# Patient Record
Sex: Male | Born: 1995 | Hispanic: Yes | Marital: Single | Smoking: Never smoker
Health system: Southern US, Community
[De-identification: ages and names within clinical notes are randomized; demographics above are authoritative.]

## PROBLEM LIST (undated history)

## (undated) DIAGNOSIS — J45909 Unspecified asthma, uncomplicated: Secondary | ICD-10-CM

## (undated) DIAGNOSIS — T7840XA Allergy, unspecified, initial encounter: Secondary | ICD-10-CM

## (undated) HISTORY — DX: Allergy, unspecified, initial encounter: T78.40XA

## (undated) HISTORY — PX: TONSILLECTOMY: SUR1361

## (undated) HISTORY — DX: Unspecified asthma, uncomplicated: J45.909

---
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
## 2017-04-18 ENCOUNTER — Ambulatory Visit (INDEPENDENT_AMBULATORY_CARE_PROVIDER_SITE_OTHER): Payer: Managed Care, Other (non HMO) | Admitting: Podiatry

## 2017-04-18 ENCOUNTER — Encounter: Payer: Self-pay | Admitting: Podiatry

## 2017-04-18 ENCOUNTER — Ambulatory Visit (INDEPENDENT_AMBULATORY_CARE_PROVIDER_SITE_OTHER): Payer: Managed Care, Other (non HMO)

## 2017-04-18 VITALS — BP 99/59 | HR 65 | Resp 16

## 2017-04-18 DIAGNOSIS — M2041 Other hammer toe(s) (acquired), right foot: Secondary | ICD-10-CM | POA: Diagnosis not present

## 2017-04-18 DIAGNOSIS — M2040 Other hammer toe(s) (acquired), unspecified foot: Secondary | ICD-10-CM

## 2017-04-18 DIAGNOSIS — M2042 Other hammer toe(s) (acquired), left foot: Secondary | ICD-10-CM

## 2017-04-18 NOTE — Patient Instructions (Signed)
Pre-Operative Instructions  Congratulations, you have decided to take an important step towards improving your quality of life.  You can be assured that the doctors and staff at [facility] will be with you every step of the way.  Here are some important things you should know:  1. Plan to be at the surgery center/hospital at least 1 (one) hour prior to your scheduled time, unless otherwise directed by the surgical center/hospital staff.  You must have a responsible adult accompany you, remain during the surgery and drive you home.  Make sure you have directions to the surgical center/hospital to ensure you arrive on time. 2. If you are having surgery at [facility] or [facility] hospitals, you will need a copy of your medical history and physical form from your family physician within one month prior to the date of surgery. We will give you a form for your primary physician to complete.  3. We make every effort to accommodate the date you request for surgery.  However, there are times where surgery dates or times have to be moved.  We will contact you as soon as possible if a change in schedule is required.   4. No aspirin/ibuprofen for one week before surgery.  If you are on aspirin, any non-steroidal anti-inflammatory medications (Mobic, Aleve, Ibuprofen) should not be taken seven (7) days prior to your surgery.  You make take Tylenol for pain prior to surgery.  5. Medications - If you are taking daily heart and blood pressure medications, seizure, reflux, allergy, asthma, anxiety, pain or diabetes medications, make sure you notify the surgery center/hospital before the day of surgery so they can tell you which medications you should take or avoid the day of surgery. 6. No food or drink after midnight the night before surgery unless directed otherwise by surgical center/hospital staff. 7. No alcoholic beverages 24-hours prior to surgery.  No smoking 24-hours prior or 24-hours after  surgery. 8. Wear loose pants or shorts. They should be loose enough to fit over bandages, boots, and casts. 9. Don't wear slip-on shoes. Sneakers are preferred. 10. Bring your boot with you to the surgery center/hospital.  Also bring crutches or a walker if your physician has prescribed it for you.  If you do not have this equipment, it will be provided for you after surgery. 11. If you have not been contacted by the surgery center/hospital by the day before your surgery, call to confirm the date and time of your surgery. 12. Leave-time from work may vary depending on the type of surgery you have.  Appropriate arrangements should be made prior to surgery with your employer. 13. Prescriptions will be provided immediately following surgery by your doctor.  Fill these as soon as possible after surgery and take the medication as directed. Pain medications will not be refilled on weekends and must be approved by the doctor. 14. Remove nail polish on the operative foot and avoid getting pedicures prior to surgery. 15. Wash the night before surgery.  The night before surgery wash the foot and leg well with water and the antibacterial soap provided. Be sure to pay special attention to beneath the toenails and in between the toes.  Wash for at least three (3) minutes. Rinse thoroughly with water and dry well with a towel.  Perform this wash unless told not to do so by your physician.  Enclosed: 1 Ice pack (please put in freezer the night before surgery)   1 Hibiclens skin cleaner     Pre-op instructions  If you have any questions regarding the instructions, please do not hesitate to call our office.  [place]: [address], [place], [place] [address] -- [phone]  [place]: [address] , [place] [address] -- [phone]  [place]: [address]  [place], [place] [address] -- [phone]  [place]: [address], [address], [place], [place] [address] -- [phone]  Website:  [email or web address] 

## 2017-04-18 NOTE — Progress Notes (Signed)
   Subjective:    Patient ID: Johnny Pierce, male    DOB: 01-Apr-1996, 21 y.o.   MRN: [id number]  HPI: He presents today with his mother with a chief complaint of painful fifth toes bilaterally. He states that his toes are starting to roll beneath his fourth toe and this is causing pain for him when he walks. He states that he walks a lot at work and it causes outside of his leg and foot to hurt.  Review of Systems  All other systems reviewed and are negative.      Objective:   Physical Exam: Vital signs are stable alert and oriented 3. Pulses are palpable. Neurologic sensorium is intact. Deep tendon reflexes are intact. Muscle strength is normal and symmetrical bilateral. Orthopedic evaluation strength all joints distal to the ankle for range of motion with crepitation. Cutaneous evaluation stress of well-hydrated cutis. No open lesions or wounds. Adductor varus rotated hammertoe deformities fifth bilateral. They're minimally reducible.. Radiographs taken today demonstrate osseous immature individual with moderate severe adductovarus rotated hammertoe deformity particularly at the level of the DIPJ.        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  Plan: After thorough discussion we consented him today for surgical evaluation and correction. We consented him for derotational arthroplasties DIPJ fifth digit bilateral. I answered all questions regarding these procedures to the best mobility limbs terms he would likely be out of work for least 4 weeks. I will follow-up with him in the near future for surgical intervention.

## 2017-04-20 ENCOUNTER — Ambulatory Visit (INDEPENDENT_AMBULATORY_CARE_PROVIDER_SITE_OTHER): Payer: Managed Care, Other (non HMO) | Admitting: Physician Assistant

## 2017-04-20 ENCOUNTER — Encounter: Payer: Self-pay | Admitting: Physician Assistant

## 2017-04-20 VITALS — BP 118/72 | HR 68 | Temp 97.7°F | Ht 64.0 in | Wt 178.6 lb

## 2017-04-20 DIAGNOSIS — Z23 Encounter for immunization: Secondary | ICD-10-CM

## 2017-04-20 DIAGNOSIS — Z131 Encounter for screening for diabetes mellitus: Secondary | ICD-10-CM | POA: Diagnosis not present

## 2017-04-20 DIAGNOSIS — M2040 Other hammer toe(s) (acquired), unspecified foot: Secondary | ICD-10-CM

## 2017-04-20 DIAGNOSIS — Z1329 Encounter for screening for other suspected endocrine disorder: Secondary | ICD-10-CM | POA: Diagnosis not present

## 2017-04-20 DIAGNOSIS — Z13220 Encounter for screening for lipoid disorders: Secondary | ICD-10-CM | POA: Diagnosis not present

## 2017-04-20 DIAGNOSIS — J45909 Unspecified asthma, uncomplicated: Secondary | ICD-10-CM

## 2017-04-20 DIAGNOSIS — Z833 Family history of diabetes mellitus: Secondary | ICD-10-CM

## 2017-04-20 DIAGNOSIS — Z0000 Encounter for general adult medical examination without abnormal findings: Secondary | ICD-10-CM

## 2017-04-20 DIAGNOSIS — Z114 Encounter for screening for human immunodeficiency virus [HIV]: Secondary | ICD-10-CM

## 2017-04-20 DIAGNOSIS — Z01818 Encounter for other preprocedural examination: Secondary | ICD-10-CM

## 2017-04-20 DIAGNOSIS — Z113 Encounter for screening for infections with a predominantly sexual mode of transmission: Secondary | ICD-10-CM

## 2017-04-20 NOTE — Progress Notes (Signed)
Patient: Johnny PartWilliam A Pierce Male    DOB: 03/12/1996   21 y.o.   MRN: [id number] Visit Date: 04/20/2017  Today's Provider: Trey SailorsAdriana M Taija Mathias, PA-C   Chief Complaint  Patient presents with  . Establish Care  . Annual Exam   Subjective:    HPI Johnny Pierce is a 21 year old male who presents today to Establish Care as a new patient. Patient [facility] a [facility]. He is feeling well. He reports exercising occasionally, running or soccer twice weekly. He is sleeping well. 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 He lives in [place] with his parents.  He is sexually active with a single male partner, uses condoms at every sexual encounter. Would like to be screened for STIs today. He smokes marijuana but does not use other drugs. No cigarettes. Drinks once per month.  He has a history of asthma well controlled with PRN albuterol inhaler. He has seasonal allergies controlled with Zyrtec.  He will be having surgery for hammer toes of both feet soon. He is unsure of surgery date at this time. Dr. Al CorpusHyatt will be doing the surgery, and he reports he needs an annual physical/surgery clearance for this. Does not have form to fill out.  He has not heart problems or sleep apnea. He has had a tonsillectomy in the past and tolerated anesthesia well. No history of sleep apnea.   Due for 2nd series of Hep A and Menveo, and also first series of HPV.     Previous Medications   ALBUTEROL (PROVENTIL HFA;VENTOLIN HFA) 108 (90 BASE) MCG/ACT INHALER    Inhale into the lungs every 6 (six) hours as needed for wheezing or shortness of breath.   CETIRIZINE (ZYRTEC) 10 MG TABLET    Take 1 tablet (10 mg total) by mouth daily.   MOMETASONE (NASONEX) 50 MCG/ACT NASAL SPRAY    Place 2 sprays into the nose daily.    Review of Systems  Constitutional: Negative.   HENT: Negative.   Eyes: Negative.   Respiratory: Negative.   Cardiovascular: Negative.     Gastrointestinal: Negative.   Endocrine: Negative.   Genitourinary: Negative.   Musculoskeletal: Negative.   Skin: Negative.   Allergic/Immunologic: Negative.   Neurological: Negative.   Hematological: Negative.   Psychiatric/Behavioral: Negative.     Social History  Substance Use Topics  . Smoking status: Never Smoker  . Smokeless tobacco: Never Used  . Alcohol use Yes     Comment: occasionally   Objective:   BP 118/72 (BP Location: Left Arm, Patient Position: Sitting, Cuff Size: Normal)   Pulse 68   Temp 97.7 F (36.5 C) (Oral)   Ht 5\' 4"  (1.626 m)   Wt 178 lb 9.6 oz (81 kg)   SpO2 98%   BMI 30.66 kg/m   Physical Exam  Constitutional: He is oriented to person, place, and time. He appears well-developed and well-nourished.  HENT:  Right Ear: External ear normal.  Left Ear: External ear normal.  Mouth/Throat: Oropharynx is clear and moist. No oropharyngeal exudate.  Eyes: Conjunctivae are normal.  Neck: Normal range of motion. Neck supple.  Cardiovascular: Normal rate and regular rhythm.   Pulmonary/Chest: Effort normal and breath sounds normal.  Abdominal: Soft. Bowel sounds are normal.  Lymphadenopathy:    He has no cervical adenopathy.  Neurological: He is alert and oriented to person, place, and time.  Skin: Skin is warm and dry.  Psychiatric: He  has a normal mood and affect. His behavior is normal.        Assessment & Plan:     1. Annual physical exam  - CBC With Differential  2. Pre-op evaluation  Normal EKG.  - EKG 12-Lead  3. Family history of diabetes mellitus   4. Asthma, unspecified asthma severity, unspecified whether complicated, unspecified whether persistent  Controlled on albuterol.  5. Hammer toe, unspecified laterality  Soon to be scheduled for surgery with Dr. Al Corpus.  6. Routine screening for STI (sexually transmitted infection)  - Chlamydia/Gonococcus/Trichomonas, NAA  7. Lipid screening  - Lipid Profile  8. Diabetes  mellitus screening  - Comprehensive metabolic panel  9. Thyroid disorder screening  - TSH  10. Encounter for screening for HIV  - HIV antibody  11. Need for HPV vaccination  Updated today. Should come back in 2 mo for second dose.  - HPV 9-valent vaccine,Recombinat  12. Need for meningitis vaccination  Updated today.  - Meningococcal MCV4O(Menveo)  13. Need for hepatitis A immunization  Updated today.    Follow up: Return in about 1 year (around 04/20/2018) for CPE.

## 2017-04-20 NOTE — Patient Instructions (Signed)

## 2017-04-21 ENCOUNTER — Telehealth: Payer: Self-pay | Admitting: Podiatry

## 2017-04-21 NOTE — Telephone Encounter (Signed)
Pt called with a question. Requested a call back at [phone].

## 2017-04-26 ENCOUNTER — Telehealth: Payer: Self-pay | Admitting: *Deleted

## 2017-04-26 NOTE — Telephone Encounter (Signed)
"  I'd like to schedule my surgery with Dr. Al CorpusHyatt."  Do you have a date in mind?  "I'd like to do it as soon as possible."  He can do it on May 27, 2017.  "That date will be fine.  What time?"  It will be sometime that morning.  Someone from the surgical center will call you with the arrival time a day or two prior to the surgery date.

## 2017-05-03 ENCOUNTER — Telehealth: Payer: Self-pay

## 2017-05-03 DIAGNOSIS — Z0000 Encounter for general adult medical examination without abnormal findings: Secondary | ICD-10-CM

## 2017-05-03 LAB — CBC WITH DIFFERENTIAL/PLATELET
Basophils Absolute: 93 cells/uL (ref 0–200)
Basophils Relative: 1 %
Eosinophils Absolute: 149 cells/uL (ref 15–500)
Eosinophils Relative: 1.6 %
HCT: 40.3 % (ref 38.5–50.0)
Hemoglobin: 12.2 g/dL — ABNORMAL LOW (ref 13.2–17.1)
Lymphs Abs: 3701 cells/uL (ref 850–3900)
MCH: 19 pg — ABNORMAL LOW (ref 27.0–33.0)
MCHC: 30.3 g/dL — ABNORMAL LOW (ref 32.0–36.0)
MCV: 62.7 fL — ABNORMAL LOW (ref 80.0–100.0)
MPV: 9.3 fL (ref 7.5–12.5)
Monocytes Relative: 13.8 %
Neutro Abs: 4073 cells/uL (ref 1500–7800)
Neutrophils Relative %: 43.8 %
Platelets: 318 10*3/uL (ref 140–400)
RBC: 6.43 10*6/uL — ABNORMAL HIGH (ref 4.20–5.80)
RDW: 17.5 % — ABNORMAL HIGH (ref 11.0–15.0)
Total Lymphocyte: 39.8 %
WBC mixed population: 1283 cells/uL — ABNORMAL HIGH (ref 200–950)
WBC: 9.3 10*3/uL (ref 3.8–10.8)

## 2017-05-03 LAB — COMPLETE METABOLIC PANEL WITH GFR
AG Ratio: 1.9 (calc) (ref 1.0–2.5)
ALT: 15 U/L (ref 8–46)
AST: 11 U/L — ABNORMAL LOW (ref 12–32)
Albumin: 4.5 g/dL (ref 3.6–5.1)
Alkaline phosphatase (APISO): 88 U/L (ref 48–230)
BUN/Creatinine Ratio: 29 (calc) — ABNORMAL HIGH (ref 6–22)
BUN: 22 mg/dL — ABNORMAL HIGH (ref 7–20)
CO2: 25 mmol/L (ref 20–32)
Calcium: 9.4 mg/dL (ref 8.9–10.4)
Chloride: 105 mmol/L (ref 98–110)
Creat: 0.75 mg/dL (ref 0.60–1.26)
GFR, Est African American: 154 mL/min/{1.73_m2} (ref >=60)
GFR, Est Non African American: 133 mL/min/{1.73_m2} (ref >=60)
Globulin: 2.4 g/dL (calc) (ref 2.1–3.5)
Glucose, Bld: 95 mg/dL (ref 65–99)
Potassium: 4.1 mmol/L (ref 3.8–5.1)
Sodium: 138 mmol/L (ref 135–146)
Total Bilirubin: 0.4 mg/dL (ref 0.2–1.1)
Total Protein: 6.9 g/dL (ref 6.3–8.2)

## 2017-05-03 LAB — HIV ANTIBODY (ROUTINE TESTING W REFLEX): HIV 1&2 Ab, 4th Generation: NONREACTIVE

## 2017-05-03 LAB — LIPID PANEL
Cholesterol: 153 mg/dL (ref <170)
HDL: 30 mg/dL — ABNORMAL LOW (ref >45)
LDL Cholesterol (Calc): 102 mg/dL (calc) (ref <110)
Non-HDL Cholesterol (Calc): 123 mg/dL (calc) — ABNORMAL HIGH (ref <120)
Total CHOL/HDL Ratio: 5.1 (calc) — ABNORMAL HIGH (ref <5.0)
Triglycerides: 109 mg/dL — ABNORMAL HIGH (ref <90)

## 2017-05-03 LAB — TSH: TSH: 2.12 mIU/L (ref 0.50–4.30)

## 2017-05-03 NOTE — Telephone Encounter (Signed)
[facility].   Thanks,   -Vernona RiegerLaura

## 2017-05-04 ENCOUNTER — Ambulatory Visit: Payer: Managed Care, Other (non HMO) | Admitting: Physician Assistant

## 2017-05-04 ENCOUNTER — Other Ambulatory Visit: Payer: Self-pay | Admitting: Physician Assistant

## 2017-05-04 ENCOUNTER — Telehealth: Payer: Self-pay

## 2017-05-04 DIAGNOSIS — Z113 Encounter for screening for infections with a predominantly sexual mode of transmission: Secondary | ICD-10-CM

## 2017-05-04 NOTE — Telephone Encounter (Signed)
-----   Message from Trey SailorsAdriana M Pollak, [place]-C sent at 05/04/2017  9:52 AM EDT ----- Labs normal. Come in to give urine sample for screening when possible, thanks.

## 2017-05-04 NOTE — Telephone Encounter (Signed)
[email or web address] 

## 2017-05-04 NOTE — Progress Notes (Signed)
Reordered STI screening for [facility], please print out that lab slip when sending off.

## 2017-05-10 NOTE — Telephone Encounter (Signed)
[name], RMA  

## 2017-05-25 ENCOUNTER — Other Ambulatory Visit: Payer: Self-pay | Admitting: Podiatry

## 2017-05-25 MED ORDER — PROMETHAZINE HCL 25 MG PO TABS: 25.0000 mg | ORAL_TABLET | Freq: Three times a day (TID) | ORAL | 0 refills | Status: DC | PRN

## 2017-05-25 MED ORDER — CEPHALEXIN 500 MG PO CAPS: 500.0000 mg | ORAL_CAPSULE | Freq: Three times a day (TID) | ORAL | 0 refills | Status: DC

## 2017-05-25 MED ORDER — OXYCODONE-ACETAMINOPHEN 10-325 MG PO TABS: 1.0000 | ORAL_TABLET | ORAL | 0 refills | Status: DC | PRN

## 2017-05-27 ENCOUNTER — Encounter: Payer: Self-pay | Admitting: Podiatry

## 2017-05-27 DIAGNOSIS — M2041 Other hammer toe(s) (acquired), right foot: Secondary | ICD-10-CM | POA: Diagnosis not present

## 2017-05-27 DIAGNOSIS — M2042 Other hammer toe(s) (acquired), left foot: Secondary | ICD-10-CM | POA: Diagnosis not present

## 2017-06-01 ENCOUNTER — Encounter: Payer: Managed Care, Other (non HMO) | Admitting: Podiatry

## 2017-06-01 ENCOUNTER — Ambulatory Visit (INDEPENDENT_AMBULATORY_CARE_PROVIDER_SITE_OTHER): Payer: Managed Care, Other (non HMO) | Admitting: Podiatry

## 2017-06-01 ENCOUNTER — Encounter: Payer: Self-pay | Admitting: Podiatry

## 2017-06-01 ENCOUNTER — Ambulatory Visit (INDEPENDENT_AMBULATORY_CARE_PROVIDER_SITE_OTHER): Payer: Managed Care, Other (non HMO)

## 2017-06-01 VITALS — BP 123/71 | HR 68 | Temp 98.2°F | Resp 16

## 2017-06-01 DIAGNOSIS — M2041 Other hammer toe(s) (acquired), right foot: Secondary | ICD-10-CM | POA: Diagnosis not present

## 2017-06-01 DIAGNOSIS — M2042 Other hammer toe(s) (acquired), left foot: Secondary | ICD-10-CM

## 2017-06-01 NOTE — Progress Notes (Signed)
He presents today for follow-up of hammertoe repair fifth bilateral. He states that he is doing pretty well. His mother states that he did leave the house for a period time yesterday. States that his toes are somewhat tender today.  Objective: Vital signs are stable alert and oriented 3 pulses are palpable. No erythema edema cellulitis drainage or odor. Sutures are intact or coapted toes are rectus. Radiographs confirm very nice derotational arthroplasties.  Assessment: Well-healing surgical toes.  Plan: Redressed a dry sterile compressive dressing follow-up with him in 1 week for possible suture removal.

## 2017-06-03 ENCOUNTER — Telehealth: Payer: Self-pay | Admitting: Podiatry

## 2017-06-03 NOTE — Telephone Encounter (Signed)
Yes, I left paperwork from my job with the fax number so she can fax my job the note. This is for me to be out of work. Either she forgot to send it or never sent it at all. Please call me back at [phone].

## 2017-06-08 ENCOUNTER — Encounter: Payer: Self-pay | Admitting: Podiatry

## 2017-06-08 ENCOUNTER — Ambulatory Visit (INDEPENDENT_AMBULATORY_CARE_PROVIDER_SITE_OTHER): Payer: Managed Care, Other (non HMO) | Admitting: Podiatry

## 2017-06-08 ENCOUNTER — Ambulatory Visit: Payer: Managed Care, Other (non HMO) | Admitting: Podiatry

## 2017-06-08 DIAGNOSIS — M2042 Other hammer toe(s) (acquired), left foot: Secondary | ICD-10-CM

## 2017-06-08 DIAGNOSIS — M2041 Other hammer toe(s) (acquired), right foot: Secondary | ICD-10-CM

## 2017-06-08 NOTE — Progress Notes (Signed)
He presents today for his second postop visit status post hammertoe repair fifth digit bilaterally. He states it feels like her doing okay to me.  Objective: Vital signs are stable he is alert and oriented 3 presents in his [place] shoes today using a cane. Dry sterile dressing was removed demonstrates no erythema edema cellulitis drainage or odor. Sutures are in place margins well coapted sutures were removed today.  Assessment: Well coapted margins hammertoe repair fifth bilateral.  Plan: We will wrap the toes on a daily basis he understands this and is amenable to it. Follow up with him in 2 weeks he will remain out of work until that time.

## 2017-06-22 ENCOUNTER — Ambulatory Visit (INDEPENDENT_AMBULATORY_CARE_PROVIDER_SITE_OTHER): Payer: Managed Care, Other (non HMO) | Admitting: Podiatry

## 2017-06-22 ENCOUNTER — Ambulatory Visit (INDEPENDENT_AMBULATORY_CARE_PROVIDER_SITE_OTHER): Payer: Managed Care, Other (non HMO)

## 2017-06-22 ENCOUNTER — Encounter: Payer: Self-pay | Admitting: Podiatry

## 2017-06-22 DIAGNOSIS — M2041 Other hammer toe(s) (acquired), right foot: Secondary | ICD-10-CM | POA: Diagnosis not present

## 2017-06-22 DIAGNOSIS — M2042 Other hammer toe(s) (acquired), left foot: Secondary | ICD-10-CM

## 2017-06-22 NOTE — Progress Notes (Signed)
He presents today for follow-up of his hammertoe repair fifth digits bilateral. He is very happy with the outcome. He states is still tender with pressure on them.  Objective: Digital compression wrap was intact once removed demonstrates nearly rectus hammertoe repair fifth digits bilateral. Mild edema no erythema cellulitis drainage or odor no skin breakdown.  Second day demonstrated the rotated fifth toes bilateral.  Assessment: Well-healing surgical toes bilateral.  Plan: I would allow him to get back to work continuing to dress the toes daily. He'll follow-up with me in 1 month.

## 2017-06-23 NOTE — Progress Notes (Signed)
DOS 10.05.18 Derotational arthroplasty 5th toe bilateral

## 2017-07-20 ENCOUNTER — Ambulatory Visit: Payer: Managed Care, Other (non HMO)

## 2017-07-20 ENCOUNTER — Encounter: Payer: Managed Care, Other (non HMO) | Admitting: Podiatry

## 2017-07-20 DIAGNOSIS — M2042 Other hammer toe(s) (acquired), left foot: Principal | ICD-10-CM

## 2017-07-20 DIAGNOSIS — M2041 Other hammer toe(s) (acquired), right foot: Secondary | ICD-10-CM

## 2017-07-20 NOTE — Progress Notes (Signed)
This encounter was created in error - please disregard.

## 2017-08-03 ENCOUNTER — Ambulatory Visit (INDEPENDENT_AMBULATORY_CARE_PROVIDER_SITE_OTHER): Payer: Managed Care, Other (non HMO) | Admitting: Podiatry

## 2017-08-03 ENCOUNTER — Ambulatory Visit (INDEPENDENT_AMBULATORY_CARE_PROVIDER_SITE_OTHER): Payer: Managed Care, Other (non HMO)

## 2017-08-03 ENCOUNTER — Encounter: Payer: Self-pay | Admitting: Podiatry

## 2017-08-03 DIAGNOSIS — M2041 Other hammer toe(s) (acquired), right foot: Secondary | ICD-10-CM

## 2017-08-03 DIAGNOSIS — M2042 Other hammer toe(s) (acquired), left foot: Secondary | ICD-10-CM | POA: Diagnosis not present

## 2017-08-03 NOTE — Progress Notes (Signed)
He presents today for his follow-up of his fifth hammertoe repair bilaterally.  He states they feel great there is no problem other than the get a little tired by the end of the day.  Objective: Vital signs are stable alert and oriented x3 toes are in good rectus position.  Mild edema to the distal aspect of the toes.  Radiographs confirm nice arthroplasties no bony overgrowth.  Assessment: Well-healing surgical tenderness.  Plan wrap the toes on a daily basis and follow-up with me on an as-needed basis.

## 2017-08-25 IMAGING — CR DG WRIST COMPLETE 3+V*R*
4 series · 4 of 4 positions shown · non-contrast
Comparison: None.

CLINICAL DATA: Lump on the anterior side of the right wrist over
the last week.

EXAM:
RIGHT WRIST - COMPLETE 3+ VIEW

[wrist pa]
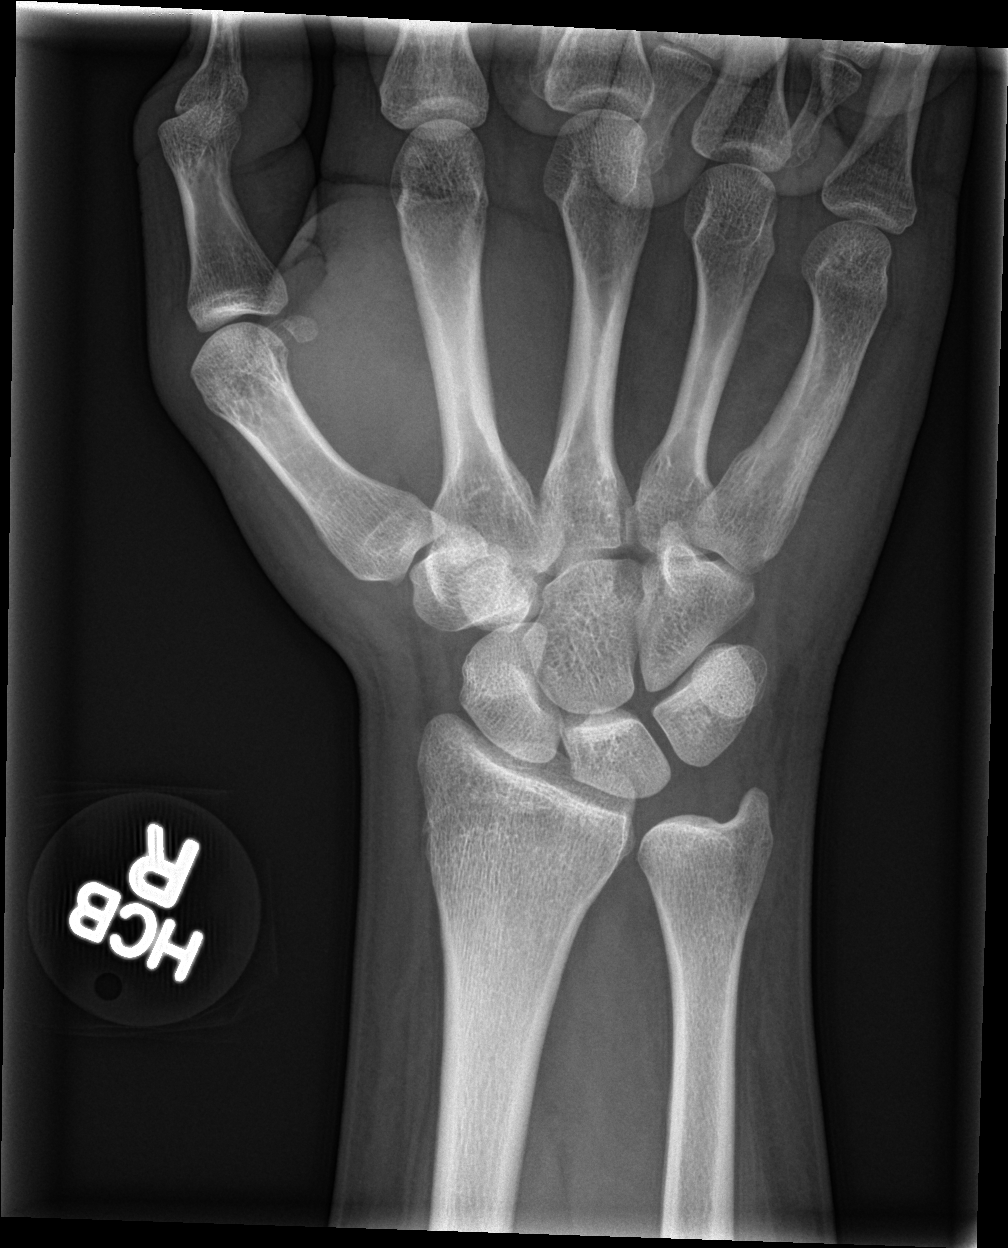

[wrist obl]
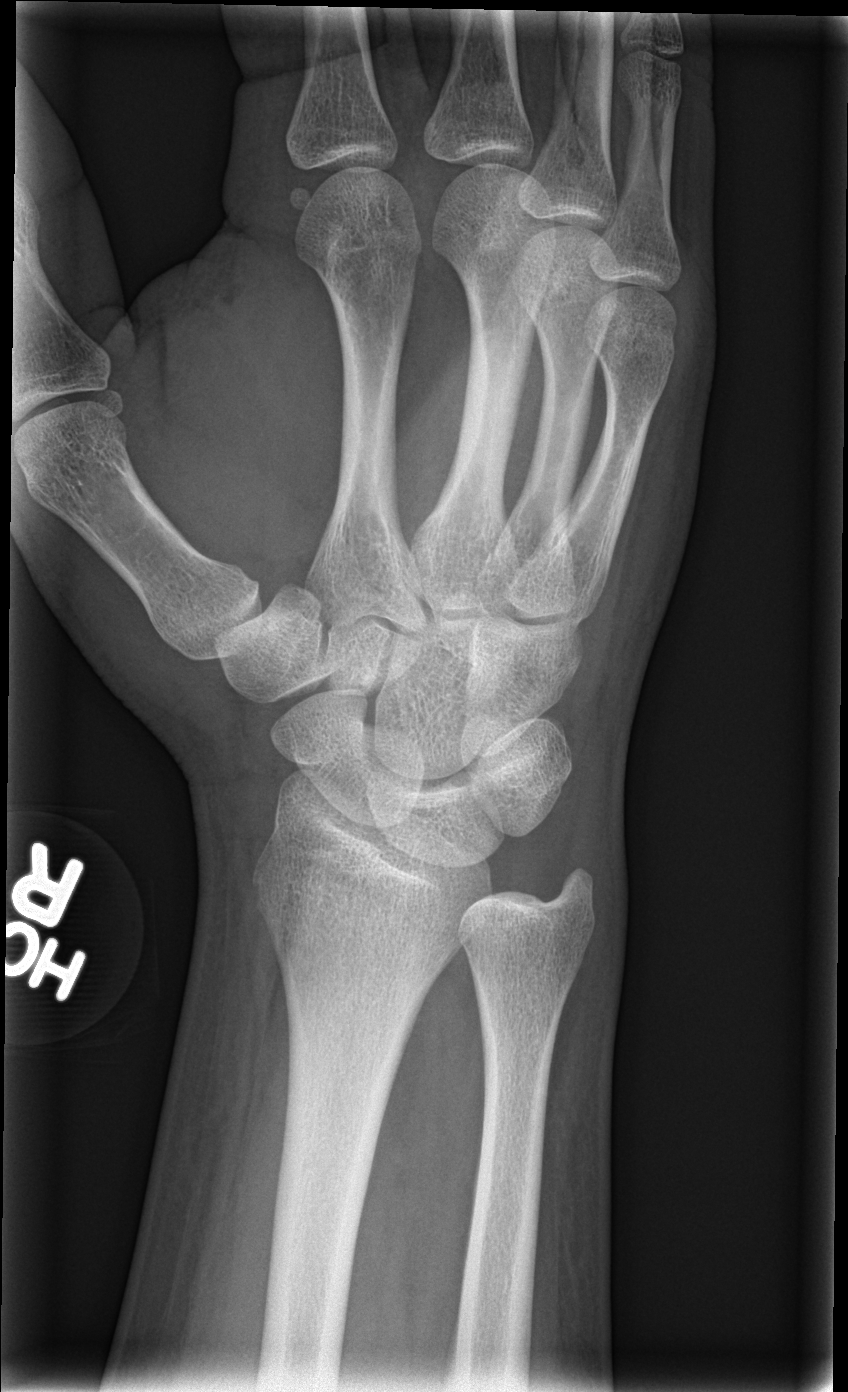

[wrist lat]
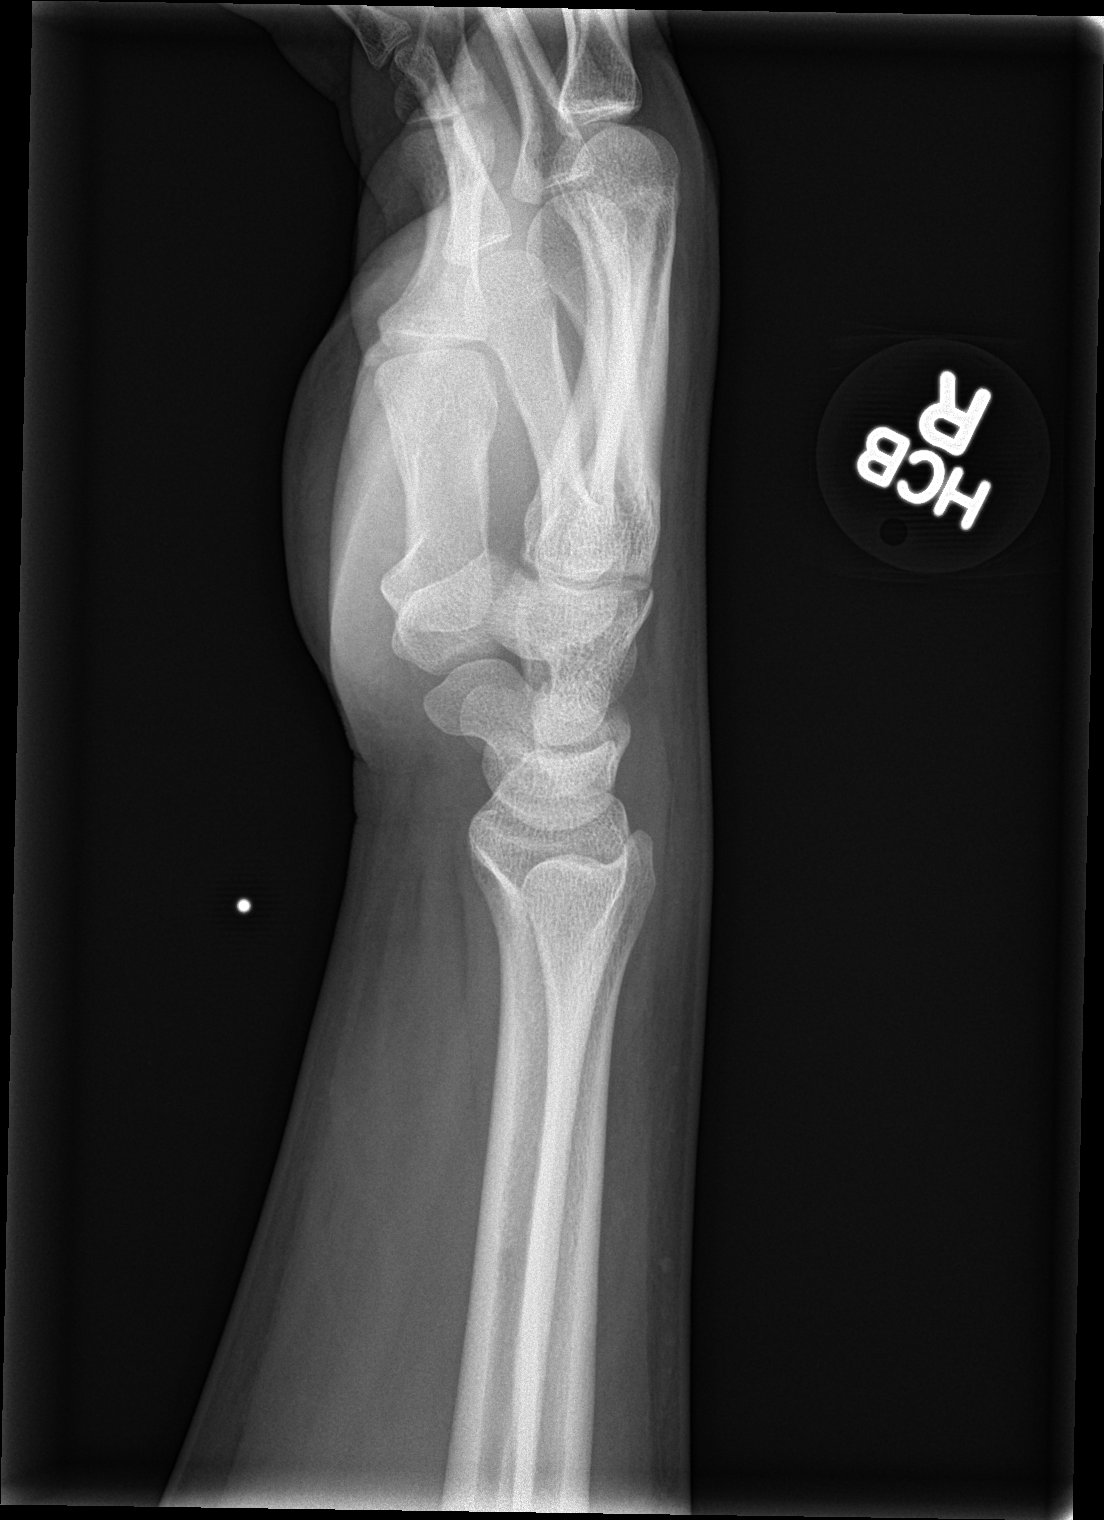

[wrist navicular]
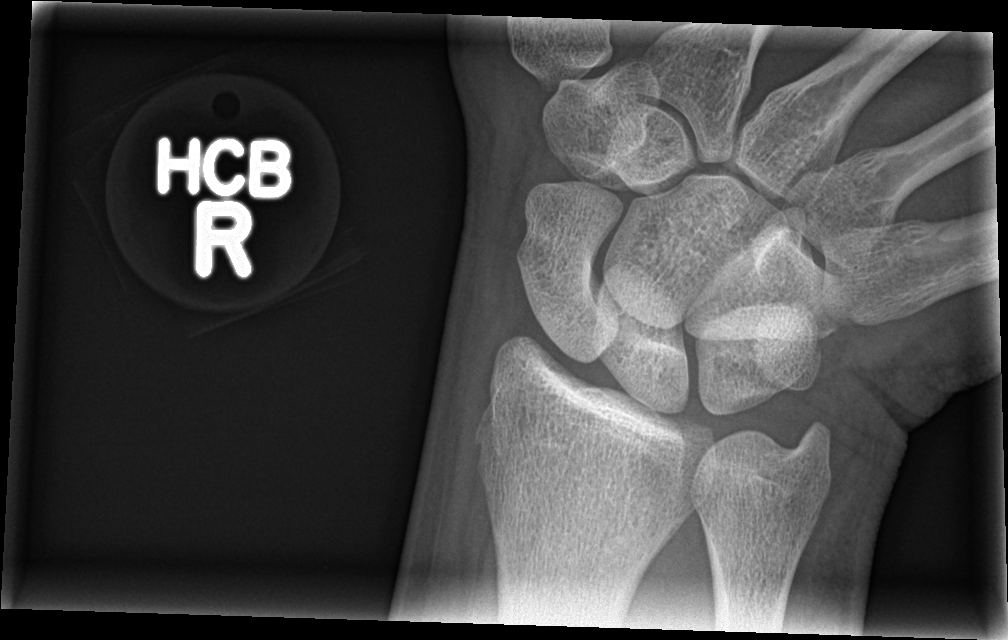

[4 of 4 positions shown; findings below may reference images not displayed]

FINDINGS: There is no evidence of fracture or dislocation. There is no
evidence of arthropathy or other focal bone abnormality. Soft
tissues are unremarkable.
IMPRESSION: Normal radiographs. Skin marker over the region of concern does not
highlight any finding.

## 2019-06-30 ENCOUNTER — Encounter: Payer: Self-pay | Admitting: Emergency Medicine

## 2019-06-30 ENCOUNTER — Other Ambulatory Visit: Payer: Self-pay

## 2019-06-30 ENCOUNTER — Ambulatory Visit
Admission: EM | Admit: 2019-06-30 | Discharge: 2019-06-30 | Disposition: A | Discharge status: Home / Self Care | Payer: No Typology Code available for payment source | Attending: Family Medicine | Admitting: Family Medicine

## 2019-06-30 DIAGNOSIS — M25521 Pain in right elbow: Secondary | ICD-10-CM

## 2019-06-30 DIAGNOSIS — X503XXA Overexertion from repetitive movements, initial encounter: Secondary | ICD-10-CM

## 2019-06-30 DIAGNOSIS — M25522 Pain in left elbow: Secondary | ICD-10-CM

## 2019-06-30 MED ORDER — CYCLOBENZAPRINE HCL 10 MG PO TABS: 10.0000 mg | ORAL_TABLET | Freq: Every evening | ORAL | 0 refills | Status: DC | PRN

## 2019-06-30 MED ORDER — MELOXICAM 15 MG PO TABS: 15.0000 mg | ORAL_TABLET | Freq: Every day | ORAL | 0 refills | Status: DC | PRN

## 2019-06-30 NOTE — ED Triage Notes (Signed)
Patient in today c/o bilateral elbow pain x 2 months, getting worse. No injury noted. Patient does a lot of repetitive motion at work. Patient has tried OTC Tylenol for symptoms.

## 2019-06-30 NOTE — ED Provider Notes (Signed)
[facility] ____________________________________________  Time seen: Approximately 9:38 AM  I have reviewed the triage vital signs and the nursing notes.   HISTORY  Chief Complaint Elbow Pain (bilateral)   HPI Johnny Pierce is a 23 y.o. male presenting for evaluation of bilateral lateral elbow pain for the last 2 months.  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  States pain is always to the same area to the lateral elbow of both sides.  States initially was left and now both.  Denies any fall, direct injury or direct trauma.  Occasionally pain radiates up and down from elbow.  Denies paresthesias, loss of handgrip, chest pain, shortness of breath, fevers, swelling, rash or recent sickness.  Reports otherwise doing well.  Has taken intermittent Tylenol without resolution, some improvement.  Denies other aggravating alleviating factors.  Has continued working.    Past Medical History:  Diagnosis Date   Allergy    Asthma     Patient Active Problem List   Diagnosis Date Noted   Family history of diabetes mellitus 04/20/2017   Asthma 04/20/2017   Hammer toe 04/20/2017    Past Surgical History:  Procedure Laterality Date   TONSILLECTOMY       No current facility-administered medications for this encounter.   Current Outpatient Medications:    albuterol (PROVENTIL HFA;VENTOLIN HFA) 108 (90 Base) MCG/ACT inhaler, Inhale into the lungs every 6 (six) hours as needed for wheezing or shortness of breath., Disp: , Rfl:    cetirizine (ZYRTEC) 10 MG tablet, Take 1 tablet (10 mg total) by mouth daily., Disp: 30 tablet, Rfl: 0   mometasone (NASONEX) 50 MCG/ACT nasal spray, Place 2 sprays into the nose daily., Disp: 17 g, Rfl: 12   cyclobenzaprine (FLEXERIL) 10 MG tablet, Take 1 tablet (10 mg total) by mouth at bedtime as needed for muscle spasms (pain). Do not drive while taking as can cause  drowsiness, Disp: 15 tablet, Rfl: 0   meloxicam (MOBIC) 15 MG tablet, Take 1 tablet (15 mg total) by mouth daily as needed., Disp: 21 tablet, Rfl: 0  Allergies Pollen extract  Family History  Problem Relation Age of Onset   GER disease Mother    Diabetes Mother    Diabetes Father    GER disease Maternal Grandmother     Social History Social History   Tobacco Use   Smoking status: Never Smoker   Smokeless tobacco: Never Used  Substance Use Topics   Alcohol use: Yes    Comment: occasionally   Drug use: Yes    Frequency: 1.0 times per week    Types: Marijuana    Comment: marijuana use occasionally    Review of Systems Constitutional: No fever ENT: No sore throat. Cardiovascular: Denies chest pain. Respiratory: Denies shortness of breath. Gastrointestinal: No abdominal pain.  Musculoskeletal: Positive elbow pain. Skin: Negative for rash. Neurological: Negative for focal weakness or numbness.   ____________________________________________   PHYSICAL EXAM:  VITAL SIGNS: ED Triage Vitals [06/30/19 0917]  Enc Vitals Group     BP 117/75     Pulse Rate 85     Resp 18     Temp 98 F (36.7 C)     Temp Source Oral     SpO2 100 %     Weight 200 lb (90.7 kg)     Height 5\' 6"  (1.676 m)     Head Circumference  Peak Flow      Pain Score 7     Pain Loc      Pain Edu?      Excl. in [place]?     Constitutional: Alert and oriented. Well appearing and in no acute distress. Eyes: Conjunctivae are normal.  ENT      Head: Normocephalic and atraumatic. Cardiovascular: Good peripheral circulation. Respiratory: Normal respiratory effort without tachypnea nor retractions.  Musculoskeletal:  Bilateral distal radial pulses equal and easily palpated.   Bilateral lateral elbows lateral epicondyle minimal tenderness to direct palpation, no swelling, no ecchymosis, no other tenderness, full range of motion present bilaterally, mild bilateral elbow pain with resisted elbow  flexion, no paresthesias, bilateral upper extremities otherwise nontender. Neurologic:  Normal speech and language. Speech is normal. No gait instability.  Skin:  Skin is warm, dry and intact. No rash noted. Psychiatric: Mood and affect are normal. Speech and behavior are normal. Patient exhibits appropriate insight and judgment   ___________________________________________   LABS (all labs ordered are listed, but only abnormal results are displayed)  Labs Reviewed - No data to display ____________________________________________   PROCEDURES Procedures   INITIAL IMPRESSION / ASSESSMENT AND PLAN / ED COURSE  Pertinent labs & imaging results that were available during my care of the patient were reviewed by me and considered in my medical decision making (see chart for details).  Well-appearing patient.  Repetitive use injury to bilateral elbows consistent with lateral epicondylitis.  Encouraged ice, avoidance of overly repetitive motions as able, good body mechanics and stretching.  Will start oral Mobic and Flexeril at night as needed.  Supportive care.Discussed indication, risks and benefits of medications with patient.  Discussed follow up with Primary care physician this week as needed. Discussed follow up and return parameters including no resolution or any worsening concerns. Patient verbalized understanding and agreed to plan.   ____________________________________________   FINAL CLINICAL IMPRESSION(S) / ED DIAGNOSES  Final diagnoses:  Pain of both elbows     ED Discharge Orders         Ordered    meloxicam (MOBIC) 15 MG tablet  Daily PRN     06/30/19 0933    cyclobenzaprine (FLEXERIL) 10 MG tablet  At bedtime PRN     06/30/19 0933           Note: This dictation was prepared with Dragon dictation along with smaller phrase technology. Any transcriptional errors that result from this process are unintentional.         Marylene Land, NP 06/30/19 [phone]

## 2019-06-30 NOTE — Discharge Instructions (Signed)
Take medication as prescribed. Rest. Drink plenty of fluids. Ice. Good body mechanics. Stretch.  Follow up with your primary care physician this week as needed. Return to Urgent care for new or worsening concerns.

## 2019-10-29 ENCOUNTER — Ambulatory Visit
Admission: EM | Admit: 2019-10-29 | Discharge: 2019-10-29 | Disposition: A | Discharge status: Home / Self Care | Payer: PRIVATE HEALTH INSURANCE | Attending: Internal Medicine | Admitting: Internal Medicine

## 2019-10-29 ENCOUNTER — Other Ambulatory Visit: Payer: Self-pay

## 2019-10-29 DIAGNOSIS — Z0000 Encounter for general adult medical examination without abnormal findings: Secondary | ICD-10-CM | POA: Diagnosis not present

## 2019-10-29 DIAGNOSIS — N50811 Right testicular pain: Secondary | ICD-10-CM

## 2019-10-29 LAB — URINALYSIS, COMPLETE (UACMP) WITH MICROSCOPIC
Bacteria, UA: NONE SEEN
Bilirubin Urine: NEGATIVE
Glucose, UA: NEGATIVE mg/dL
Hgb urine dipstick: NEGATIVE
Ketones, ur: NEGATIVE mg/dL
Leukocytes,Ua: NEGATIVE
Nitrite: NEGATIVE
Protein, ur: NEGATIVE mg/dL
Specific Gravity, Urine: 1.02 (ref 1.005–1.030)
Squamous Epithelial / HPF: NONE SEEN (ref 0–5)
WBC, UA: NONE SEEN WBC/hpf (ref 0–5)
pH: 7.5 (ref 5.0–8.0)

## 2019-10-29 LAB — CHLAMYDIA/NGC RT PCR (ARMC ONLY)
Chlamydia Tr: NOT DETECTED
N gonorrhoeae: NOT DETECTED

## 2019-10-29 NOTE — Discharge Instructions (Signed)
Do not wear very tight underwear. Avoid wearing very tight construction clothen or harness. If pain worsens return to urgent care for ultrasound of testes.

## 2019-10-29 NOTE — ED Triage Notes (Signed)
Patient states that he has been having right testicle pain that has been ongoing x 5-6 days. States that the pain is worse with sitting and "notices a cold feeling". Patient states that he works in [employer] and was unsure if he might have lifted something too heavy.

## 2019-10-29 NOTE — ED Provider Notes (Addendum)
[facility]    CSN: [id number] Arrival date & time: 10/29/19  1457      History   Chief Complaint Chief Complaint  Patient presents with  . Testicle Pain    right    HPI Johnny Pierce is a 24 y.o. male comes to urgent care with 1 week history of sensation of cold feeling in the right testis when he sits down.  Symptoms started a week ago and has been persistent.  He denies any trauma to the testis.  No testicular pain.  No scrotal or erythema.  No fever or chills.  Patient denies any dysuria urgency or frequency.  He is sexually active and engages in unprotected sexual intercourse.  No penile discharge.   Patient has monthly self-examination and has not noticed any masses in the testes or change in the size of the testes. Patient works with a [employer], [employer] and was very tight pants as well as tight harness use on the job. HPI  Past Medical History:  Diagnosis Date  . Allergy   . Asthma     Patient Active Problem List   Diagnosis Date Noted  . Family history of diabetes mellitus 04/20/2017  . Asthma 04/20/2017  . Hammer toe 04/20/2017    Past Surgical History:  Procedure Laterality Date  . TONSILLECTOMY         Home Medications    Prior to Admission medications   Medication Sig Start Date End Date Taking? Authorizing Provider  albuterol (PROVENTIL HFA;VENTOLIN HFA) 108 (90 Base) MCG/ACT inhaler Inhale into the lungs every 6 (six) hours as needed for wheezing or shortness of breath.   Yes [provider]  mometasone (NASONEX) 50 MCG/ACT nasal spray Place 2 sprays into the nose daily. 10/12/15  Yes Jan Fireman, PA-C  cetirizine (ZYRTEC) 10 MG tablet Take 1 tablet (10 mg total) by mouth daily. 10/12/15 10/29/19  Jan Fireman, PA-C  promethazine (PHENERGAN) 25 MG tablet Take 1 tablet (25 mg total) by mouth every 8 (eight) hours as needed for nausea or vomiting. 05/25/17 06/30/19  Hyatt, Romilda Garret, DPM    Family History Family History  Problem  Relation Age of Onset  . GER disease Mother   . Diabetes Mother   . Diabetes Father   . GER disease Maternal Grandmother     Social History Social History   Tobacco Use  . Smoking status: Never Smoker  . Smokeless tobacco: Never Used  Substance Use Topics  . Alcohol use: Yes    Comment: occasionally  . Drug use: Not Currently    Frequency: 1.0 times per week    Types: Marijuana    Comment: former user     Allergies   Pollen extract   Review of Systems Review of Systems  Constitutional: Negative for unexpected weight change.  Gastrointestinal: Negative for abdominal pain, nausea and vomiting.  Genitourinary: Negative for discharge, dysuria, frequency, genital sores, penile pain and scrotal swelling.  Musculoskeletal: Negative.   Skin: Negative for color change, pallor and rash.  Neurological: Negative for dizziness, light-headedness and headaches.     Physical Exam Triage Vital Signs ED Triage Vitals  Enc Vitals Group     BP 10/29/19 1512 129/81     Pulse Rate 10/29/19 1512 73     Resp 10/29/19 1512 18     Temp 10/29/19 1512 98.2 F (36.8 C)     Temp Source 10/29/19 1512 Oral     SpO2 10/29/19 1512 100 %  Weight 10/29/19 1508 195 lb (88.5 kg)     Height 10/29/19 1508 5\' 6"  (1.676 m)     Head Circumference --      Peak Flow --      Pain Score 10/29/19 1508 5     Pain Loc --      Pain Edu? --      Excl. in [place]? --    No data found.  Updated Vital Signs BP 129/81 (BP Location: Right Arm)   Pulse 73   Temp 98.2 F (36.8 C) (Oral)   Resp 18   Ht 5\' 6"  (1.676 m)   Wt 88.5 kg   SpO2 100%   BMI 31.47 kg/m   Visual Acuity Right Eye Distance:   Left Eye Distance:   Bilateral Distance:    Right Eye Near:   Left Eye Near:    Bilateral Near:     Physical Exam Vitals and nursing note reviewed.  Constitutional:      General: He is not in acute distress.    Appearance: He is not ill-appearing.  Cardiovascular:     Rate and Rhythm: Normal rate  and regular rhythm.     Pulses: Normal pulses.     Heart sounds: Normal heart sounds. No murmur. No friction rub.  Pulmonary:     Effort: Pulmonary effort is normal. No respiratory distress.     Breath sounds: No wheezing or rhonchi.  Genitourinary:    Penis: Normal.      Testes: Normal.     Comments: Scrotal skin is without erythema, swelling or discharge.  Testis is without any masses or tenderness.  No ulcerations or lesions on the penile shaft. Musculoskeletal:        General: No swelling, tenderness or signs of injury. Normal range of motion.  Skin:    Capillary Refill: Capillary refill takes less than 2 seconds.  Neurological:     Mental Status: He is alert.      UC Treatments / Results  Labs (all labs ordered are listed, but only abnormal results are displayed) Labs Reviewed  CHLAMYDIA/NGC RT PCR ([facility] ONLY)  URINALYSIS, COMPLETE ([facility]) WITH MICROSCOPIC    EKG   Radiology No results found.  Procedures Procedures (including critical care time)  Medications Ordered in UC Medications - No data to display  Initial Impression / Assessment and Plan / UC Course  I have reviewed the triage vital signs and the nursing notes.  Pertinent labs & imaging results that were available during my care of the patient were reviewed by me and considered in my medical decision making (see chart for details).     1.  Scrotal skin sensitivity: Avoid wearing tight pants Genital guard may be helpful when at work.   Avoid tight harnesses If you notice any erythema, swelling, discharge or increasing pain return to urgent care to be reevaluated No indication for ultrasound at this time No indication for testicular torsion both in the history of presenting illness as well as physical exam. Return precautions given. Final Clinical Impressions(s) / UC Diagnoses   Final diagnoses:  Normal scrotum on examination     Discharge Instructions     Do not wear very tight  underwear. Avoid wearing very tight construction clothen or harness. If pain worsens return to urgent care for ultrasound of testes.   ED Prescriptions    None     PDMP not reviewed this encounter.   12/29/19, MD 10/29/19 1640    Shalev Helminiak,  Britta Mccreedy, MD 10/29/19 [phone]

## 2020-05-10 ENCOUNTER — Ambulatory Visit
Admission: EM | Admit: 2020-05-10 | Discharge: 2020-05-10 | Disposition: A | Discharge status: Home / Self Care | Payer: BC Managed Care – PPO | Attending: Emergency Medicine | Admitting: Emergency Medicine

## 2020-05-10 ENCOUNTER — Other Ambulatory Visit: Payer: Self-pay

## 2020-05-10 ENCOUNTER — Encounter: Payer: Self-pay | Admitting: Emergency Medicine

## 2020-05-10 ENCOUNTER — Ambulatory Visit (INDEPENDENT_AMBULATORY_CARE_PROVIDER_SITE_OTHER): Payer: BC Managed Care – PPO

## 2020-05-10 DIAGNOSIS — M7989 Other specified soft tissue disorders: Secondary | ICD-10-CM | POA: Diagnosis not present

## 2020-05-10 DIAGNOSIS — S62397A Other fracture of fifth metacarpal bone, left hand, initial encounter for closed fracture: Secondary | ICD-10-CM

## 2020-05-10 DIAGNOSIS — M25541 Pain in joints of right hand: Secondary | ICD-10-CM

## 2020-05-10 DIAGNOSIS — S6991XA Unspecified injury of right wrist, hand and finger(s), initial encounter: Secondary | ICD-10-CM | POA: Diagnosis not present

## 2020-05-10 DIAGNOSIS — S62345A Nondisplaced fracture of base of fourth metacarpal bone, left hand, initial encounter for closed fracture: Secondary | ICD-10-CM

## 2020-05-10 DIAGNOSIS — S62667A Nondisplaced fracture of distal phalanx of left little finger, initial encounter for closed fracture: Secondary | ICD-10-CM

## 2020-05-10 MED ORDER — IBUPROFEN 800 MG PO TABS: 800.0000 mg | ORAL_TABLET | Freq: Three times a day (TID) | ORAL | 0 refills | Status: AC | PRN

## 2020-05-10 NOTE — ED Provider Notes (Signed)
[facility]    CSN: [id number] Arrival date & time: 05/10/20  1109      History   Chief Complaint Chief Complaint  Patient presents with  . Hand Injury    right    HPI Johnny Pierce is a 23 y.o. male.   Johnny Pierce presents with complaints of right hand pain. He was moving a fence, it slipped, causing him to strike a tree with a fisted hand. This occurred two days ago. No numbness or tingling. Pain to hand at 4th and 5th metacarpals. No previous injury to the hand and hasn't taken any medications for pain. Swelling and bruising present. He is right handed.    ROS per HPI, negative if not otherwise mentioned.      Past Medical History:  Diagnosis Date  . Allergy   . Asthma     Patient Active Problem List   Diagnosis Date Noted  . Family history of diabetes mellitus 04/20/2017  . Asthma 04/20/2017  . Hammer toe 04/20/2017    Past Surgical History:  Procedure Laterality Date  . TONSILLECTOMY         Home Medications    Prior to Admission medications   Medication Sig Start Date End Date Taking? Authorizing Provider  albuterol (PROVENTIL HFA;VENTOLIN HFA) 108 (90 Base) MCG/ACT inhaler Inhale into the lungs every 6 (six) hours as needed for wheezing or shortness of breath.    [provider]  ibuprofen (ADVIL) 800 MG tablet Take 1 tablet (800 mg total) by mouth every 8 (eight) hours as needed. 05/10/20   Georgetta Haber, NP  mometasone (NASONEX) 50 MCG/ACT nasal spray Place 2 sprays into the nose daily. 10/12/15   Rae Halsted, PA-C  cetirizine (ZYRTEC) 10 MG tablet Take 1 tablet (10 mg total) by mouth daily. 10/12/15 10/29/19  Rae Halsted, PA-C  promethazine (PHENERGAN) 25 MG tablet Take 1 tablet (25 mg total) by mouth every 8 (eight) hours as needed for nausea or vomiting. 05/25/17 06/30/19  Hyatt, Annye Rusk, DPM    Family History Family History  Problem Relation Age of Onset  . GER disease Mother   . Diabetes Mother   . Diabetes  Father   . GER disease Maternal Grandmother     Social History Social History   Tobacco Use  . Smoking status: Never Smoker  . Smokeless tobacco: Never Used  Vaping Use  . Vaping Use: Never used  Substance Use Topics  . Alcohol use: Yes    Comment: occasionally  . Drug use: Yes    Frequency: 1.0 times per week    Types: Marijuana    Comment: former user     Allergies   Pollen extract   Review of Systems Review of Systems   Physical Exam Triage Vital Signs ED Triage Vitals  Enc Vitals Group     BP 05/10/20 1125 136/88     Pulse Rate 05/10/20 1125 71     Resp 05/10/20 1125 18     Temp 05/10/20 1125 98.5 F (36.9 C)     Temp Source 05/10/20 1125 Oral     SpO2 05/10/20 1125 100 %     Weight 05/10/20 1122 195 lb 1.7 oz (88.5 kg)     Height 05/10/20 1122 5\' 6"  (1.676 m)     Head Circumference --      Peak Flow --      Pain Score 05/10/20 1210 6     Pain Loc --  Pain Edu? --      Excl. in [place]? --    No data found.  Updated Vital Signs BP 136/88 (BP Location: Left Arm)   Pulse 71   Temp 98.5 F (36.9 C) (Oral)   Resp 18   Ht 5\' 6"  (1.676 m)   Wt 195 lb 1.7 oz (88.5 kg)   SpO2 100%   BMI 31.49 kg/m   Visual Acuity Right Eye Distance:   Left Eye Distance:   Bilateral Distance:    Right Eye Near:   Left Eye Near:    Bilateral Near:     Physical Exam Constitutional:      Appearance: He is well-developed.  Cardiovascular:     Rate and Rhythm: Normal rate.  Pulmonary:     Effort: Pulmonary effort is normal.  Musculoskeletal:     Right hand: Swelling, tenderness and bony tenderness present. Normal range of motion.     Comments: Pain, bruising, swelling over 4th and 5th metacarpal; full ROM of the fingers and cap refill WNL  Skin:    General: Skin is warm and dry.  Neurological:     Mental Status: He is alert and oriented to person, place, and time.      UC Treatments / Results  Labs (all labs ordered are listed, but only abnormal  results are displayed) Labs Reviewed - No data to display  EKG   Radiology DG Hand Complete Right  Result Date: 05/10/2020 CLINICAL DATA:  Right hand pain and swelling 2 days after injury. Pain mostly over fourth and fifth metacarpals. EXAM: RIGHT HAND - COMPLETE 3+ VIEW COMPARISON:  05/29/2016 FINDINGS: Examination demonstrates a displaced fracture over the distal aspect of the fifth metacarpal with volar angulation of the distal fragment. There is also evidence of a minimally displaced fracture involving the base of the fourth metacarpal. Minimally displaced fracture involving the distal aspect of the fifth distal phalanx. Remainder of the exam is unremarkable. IMPRESSION: Fractures of the fourth and fifth metacarpals as well as the fifth distal phalanx as described. Electronically Signed   By: 07/29/2016 M.D.   On: 05/10/2020 11:50    Procedures Procedures (including critical care time)  Medications Ordered in UC Medications - No data to display  Initial Impression / Assessment and Plan / UC Course  I have reviewed the triage vital signs and the nursing notes.  Pertinent labs & imaging results that were available during my care of the patient were reviewed by me and considered in my medical decision making (see chart for details).     4th and 5th metacarpal fractures. Ulnar gutter placed, follow up with ortho on Monday. Incidental distal phalanx fracture noted, patient states about a month ago he slammed the finger in a car door, it is much improved. Pain management discussed. Patient verbalized understanding and agreeable to plan.   Final Clinical Impressions(s) / UC Diagnoses   Final diagnoses:  Closed nondisplaced fracture of base of fourth metacarpal bone of left hand, initial encounter  Closed displaced fracture of other Pierce of fifth metacarpal bone of left hand, initial encounter  Closed nondisplaced fracture of distal phalanx of left little finger, initial encounter      Discharge Instructions     There is a fracture in both locations I showed you on xray- the base of your ring finger as well as your pinky finger.  Wear the splint applied until you see orthopedics for definitive treatment.  Call them on Monday for follow up  next week.  Ice, elevation, use of ibuprofen every 8 hours for pain control.    ED Prescriptions    Medication Sig Dispense Auth. Provider   ibuprofen (ADVIL) 800 MG tablet Take 1 tablet (800 mg total) by mouth every 8 (eight) hours as needed. 30 tablet Georgetta Haber, NP     PDMP not reviewed this encounter.   Georgetta Haber, NP 05/10/20 1304

## 2020-05-10 NOTE — ED Triage Notes (Signed)
Pt c/o right hand pain and swelling. He states about 2 days ago he was moving furniture and his hand hit a tree. He has some bruising and decreased ROM.

## 2020-05-10 NOTE — Discharge Instructions (Signed)
There is a fracture in both locations I showed you on xray- the base of your ring finger as well as your pinky finger.  Wear the splint applied until you see orthopedics for definitive treatment.  Call them on Monday for follow up next week.  Ice, elevation, use of ibuprofen every 8 hours for pain control.

## 2020-08-21 ENCOUNTER — Ambulatory Visit
Admission: EM | Admit: 2020-08-21 | Discharge: 2020-08-21 | Disposition: A | Discharge status: Home / Self Care | Payer: BC Managed Care – PPO | Attending: Physician Assistant | Admitting: Physician Assistant

## 2020-08-21 ENCOUNTER — Encounter: Payer: Self-pay | Admitting: Emergency Medicine

## 2020-08-21 ENCOUNTER — Other Ambulatory Visit: Payer: Self-pay

## 2020-08-21 DIAGNOSIS — U071 COVID-19: Secondary | ICD-10-CM

## 2020-08-21 DIAGNOSIS — R0981 Nasal congestion: Secondary | ICD-10-CM

## 2020-08-21 DIAGNOSIS — R059 Cough, unspecified: Secondary | ICD-10-CM

## 2020-08-21 LAB — RESP PANEL BY RT-PCR (FLU A&B, COVID) ARPGX2
Influenza A by PCR: NEGATIVE
Influenza B by PCR: NEGATIVE
SARS Coronavirus 2 by RT PCR: POSITIVE — AB

## 2020-08-21 NOTE — ED Triage Notes (Signed)
Patient c/o cough, nasal congestion, chills that started 2 days ago.  

## 2020-08-21 NOTE — ED Provider Notes (Signed)
[facility]    CSN: [id number] Arrival date & time: 08/21/20  1255      History   Chief Complaint Chief Complaint  Patient presents with  . Cough  . Nasal Congestion  . [phone]    HPI Johnny Pierce is a 24 y.o. male presenting for 2-day history of chills, cough, nasal congestion, reduced sense of taste, and fatigue.  Patient denies any known COVID-19 exposure and has not received Covid vaccines.  Patient says his girlfriend is sick with similar symptoms.  He states that they just got back from a trip.  He does have a history of asthma and allergies.  He denies any recorded fevers.  Denies any weakness, chest pain or tightness or breathing problems.  Has been taking over-the-counter Mucinex.  Has no other concerns today.  HPI  Past Medical History:  Diagnosis Date  . Allergy   . Asthma     Patient Active Problem List   Diagnosis Date Noted  . Family history of diabetes mellitus 04/20/2017  . Asthma 04/20/2017  . Hammer toe 04/20/2017    Past Surgical History:  Procedure Laterality Date  . TONSILLECTOMY         Home Medications    Prior to Admission medications   Medication Sig Start Date End Date Taking? Authorizing Provider  albuterol (PROVENTIL HFA;VENTOLIN HFA) 108 (90 Base) MCG/ACT inhaler Inhale into the lungs every 6 (six) hours as needed for wheezing or shortness of breath.    [provider]  ibuprofen (ADVIL) 800 MG tablet Take 1 tablet (800 mg total) by mouth every 8 (eight) hours as needed. 05/10/20   Georgetta Haber, NP  mometasone (NASONEX) 50 MCG/ACT nasal spray Place 2 sprays into the nose daily. 10/12/15   Rae Halsted, PA-C  cetirizine (ZYRTEC) 10 MG tablet Take 1 tablet (10 mg total) by mouth daily. 10/12/15 10/29/19  Rae Halsted, PA-C  promethazine (PHENERGAN) 25 MG tablet Take 1 tablet (25 mg total) by mouth every 8 (eight) hours as needed for nausea or vomiting. 05/25/17 06/30/19  Hyatt, Annye Rusk, DPM    Family  History Family History  Problem Relation Age of Onset  . GER disease Mother   . Diabetes Mother   . Diabetes Father   . GER disease Maternal Grandmother     Social History Social History   Tobacco Use  . Smoking status: Never Smoker  . Smokeless tobacco: Never Used  Vaping Use  . Vaping Use: Never used  Substance Use Topics  . Alcohol use: Yes    Comment: occasionally  . Drug use: Yes    Frequency: 1.0 times per week    Types: Marijuana    Comment: former user     Allergies   Pollen extract   Review of Systems Review of Systems  Constitutional: Positive for chills and fatigue. Negative for fever.  HENT: Positive for congestion and rhinorrhea. Negative for sinus pressure, sinus pain and sore throat.   Respiratory: Positive for cough. Negative for shortness of breath and wheezing.   Cardiovascular: Negative for chest pain.  Gastrointestinal: Negative for abdominal pain, diarrhea, nausea and vomiting.  Musculoskeletal: Positive for myalgias.  Neurological: Negative for weakness, light-headedness and headaches.  Hematological: Negative for adenopathy.     Physical Exam Triage Vital Signs ED Triage Vitals  Enc Vitals Group     BP 08/21/20 1606 128/78     Pulse Rate 08/21/20 1606 76     Resp 08/21/20 1606 18  Temp 08/21/20 1606 98 F (36.7 C)     Temp Source 08/21/20 1606 Oral     SpO2 08/21/20 1606 100 %     Weight 08/21/20 1546 200 lb (90.7 kg)     Height 08/21/20 1546 5\' 6"  (1.676 m)     Head Circumference --      Peak Flow --      Pain Score 08/21/20 1545 0     Pain Loc --      Pain Edu? --      Excl. in GC? --    No data found.  Updated Vital Signs BP 128/78 (BP Location: Right Arm)   Pulse 76   Temp 98 F (36.7 C) (Oral)   Resp 18   Ht 5\' 6"  (1.676 m)   Wt 200 lb (90.7 kg)   SpO2 100%   BMI 32.28 kg/m   Physical Exam Vitals and nursing note reviewed.  Constitutional:      General: He is not in acute distress.    Appearance: Normal  appearance. He is well-developed and well-nourished. He is not ill-appearing or diaphoretic.  HENT:     Head: Normocephalic and atraumatic.     Nose: Congestion present.     Mouth/Throat:     Mouth: Oropharynx is clear and moist and mucous membranes are normal.     Pharynx: Uvula midline. No oropharyngeal exudate.     Tonsils: No tonsillar abscesses.  Eyes:     General: No scleral icterus.       Right eye: No discharge.        Left eye: No discharge.     Extraocular Movements: EOM normal.     Conjunctiva/sclera: Conjunctivae normal.  Neck:     Thyroid: No thyromegaly.     Trachea: No tracheal deviation.  Cardiovascular:     Rate and Rhythm: Normal rate and regular rhythm.     Heart sounds: Normal heart sounds.  Pulmonary:     Effort: Pulmonary effort is normal. No respiratory distress.     Breath sounds: Normal breath sounds. No wheezing or rales.  Musculoskeletal:     Cervical back: Normal range of motion and neck supple.  Lymphadenopathy:     Cervical: No cervical adenopathy.  Skin:    General: Skin is warm and dry.     Findings: No rash.  Neurological:     General: No focal deficit present.     Mental Status: He is alert. Mental status is at baseline.     Motor: No weakness.  Psychiatric:        Mood and Affect: Mood normal.        Behavior: Behavior normal.        Thought Content: Thought content normal.      UC Treatments / Results  Labs (all labs ordered are listed, but only abnormal results are displayed) Labs Reviewed  RESP PANEL BY RT-PCR (FLU A&B, COVID) ARPGX2 - Abnormal; Notable for the following components:      Result Value   SARS Coronavirus 2 by RT PCR POSITIVE (*)    All other components within normal limits    EKG   Radiology No results found.  Procedures Procedures (including critical care time)  Medications Ordered in UC Medications - No data to display  Initial Impression / Assessment and Plan / UC Course  I have reviewed the  triage vital signs and the nursing notes.  Pertinent labs & imaging results that were available during my care  of the patient were reviewed by me and considered in my medical decision making (see chart for details).   Respiratory panel positive for COVID-19.  Patient is unvaccinated and does have history of asthma.  I reached out to the infusion center about MAB therapy.  CDC guidelines, isolation protocol, and ED precautions reviewed with patient.  Advised him to continue his at home inhalers and breathing treatments.  Advised that if his breathing worsens he needs to go to ED.  He is not complaining of any chest pain or breathing problems at this point.   Final Clinical Impressions(s) / UC Diagnoses   Final diagnoses:  COVID-19  Cough  Nasal congestion     Discharge Instructions     +COVID test  If symptomatic, go home and rest. Push fluids. Take Tylenol as needed for discomfort. Gargle warm salt water. Throat lozenges. Take Mucinex DM or Robitussin for cough. Humidifier in bedroom to ease coughing. Warm showers. Also review the COVID handout for more information.  COVID-19 INFECTION: The incubation period of COVID-19 is approximately 14 days after exposure, with most symptoms developing in roughly 4-5 days. Symptoms may range in severity from mild to critically severe. Roughly 80% of those infected will have mild symptoms. People of any age may become infected with COVID-19 and have the ability to transmit the virus. The most common symptoms include: fever, fatigue, cough, body aches, headaches, sore throat, nasal congestion, shortness of breath, nausea, vomiting, diarrhea, changes in smell and/or taste.    COURSE OF ILLNESS Some patients may begin with mild disease which can progress quickly into critical symptoms. If your symptoms are worsening please call ahead to the Emergency Department and proceed there for further treatment. Recovery time appears to be roughly 1-2 weeks for mild  symptoms and 3-6 weeks for severe disease.   GO IMMEDIATELY TO ER FOR FEVER YOU ARE UNABLE TO GET DOWN WITH TYLENOL, BREATHING PROBLEMS, CHEST PAIN, FATIGUE, LETHARGY, INABILITY TO EAT OR DRINK, ETC  QUARANTINE AND ISOLATION: To help decrease the spread of COVID-19 please remain isolated if you have COVID infection or are highly suspected to have COVID infection. This means -stay home and isolate to one room in the home if you live with others. Do not share a bed or bathroom with others while ill, sanitize and wipe down all countertops and keep common areas clean and disinfected. You may discontinue isolation if you have a mild case and are asymptomatic 10 days after symptom onset as long as you have been fever free >24 hours without having to take Motrin or Tylenol. If your case is more severe (meaning you develop pneumonia or are admitted in the hospital), you may have to isolate longer.   If you have been in close contact (within 6 feet) of someone diagnosed with COVID 19, you are advised to quarantine in your home for 14 days as symptoms can develop anywhere from 2-14 days after exposure to the virus. If you develop symptoms, you  must isolate.  Most current guidelines for COVID after exposure -isolate 10 days if you ARE NOT tested for COVID as long as symptoms do not develop -isolate 7 days if you are tested and remain asymptomatic -You do not necessarily need to be tested for COVID if you have + exposure and        develop   symptoms. Just isolate at home x10 days from symptom onset During this global pandemic, CDC advises to practice social distancing, try to stay at  least 38ft away from others at all times. Wear a face covering. Wash and sanitize your hands regularly and avoid going anywhere that is not necessary.  KEEP IN MIND THAT THE COVID TEST IS NOT 100% ACCURATE AND YOU SHOULD STILL DO EVERYTHING TO PREVENT POTENTIAL SPREAD OF VIRUS TO OTHERS (WEAR MASK, WEAR GLOVES, WASH HANDS AND SANITIZE  REGULARLY). IF INITIAL TEST IS NEGATIVE, THIS MAY NOT MEAN YOU ARE DEFINITELY NEGATIVE. MOST ACCURATE TESTING IS DONE 5-7 DAYS AFTER EXPOSURE.   It is not advised by CDC to get re-tested after receiving a positive COVID test since you can still test positive for weeks to months after you have already cleared the virus.   *If you have not been vaccinated for COVID, I strongly suggest you consider getting vaccinated as long as there are no contraindications.      ED Prescriptions    None     PDMP not reviewed this encounter.   Shirlee Latch, PA-C 08/21/20 1709

## 2020-08-21 NOTE — Discharge Instructions (Addendum)
+COVID test  If symptomatic, go home and rest. Push fluids. Take Tylenol as needed for discomfort. Gargle warm salt water. Throat lozenges. Take Mucinex DM or Robitussin for cough. Humidifier in bedroom to ease coughing. Warm showers. Also review the COVID handout for more information.  COVID-19 INFECTION: The incubation period of COVID-19 is approximately 14 days after exposure, with most symptoms developing in roughly 4-5 days. Symptoms may range in severity from mild to critically severe. Roughly 80% of those infected will have mild symptoms. People of any age may become infected with COVID-19 and have the ability to transmit the virus. The most common symptoms include: fever, fatigue, cough, body aches, headaches, sore throat, nasal congestion, shortness of breath, nausea, vomiting, diarrhea, changes in smell and/or taste.    COURSE OF ILLNESS Some patients may begin with mild disease which can progress quickly into critical symptoms. If your symptoms are worsening please call ahead to the Emergency Department and proceed there for further treatment. Recovery time appears to be roughly 1-2 weeks for mild symptoms and 3-6 weeks for severe disease.   GO IMMEDIATELY TO ER FOR FEVER YOU ARE UNABLE TO GET DOWN WITH TYLENOL, BREATHING PROBLEMS, CHEST PAIN, FATIGUE, LETHARGY, INABILITY TO EAT OR DRINK, ETC  QUARANTINE AND ISOLATION: To help decrease the spread of COVID-19 please remain isolated if you have COVID infection or are highly suspected to have COVID infection. This means -stay home and isolate to one room in the home if you live with others. Do not share a bed or bathroom with others while ill, sanitize and wipe down all countertops and keep common areas clean and disinfected. You may discontinue isolation if you have a mild case and are asymptomatic 10 days after symptom onset as long as you have been fever free >24 hours without having to take Motrin or Tylenol. If your case is more severe  (meaning you develop pneumonia or are admitted in the hospital), you may have to isolate longer.   If you have been in close contact (within 6 feet) of someone diagnosed with COVID 19, you are advised to quarantine in your home for 14 days as symptoms can develop anywhere from 2-14 days after exposure to the virus. If you develop symptoms, you  must isolate.  Most current guidelines for COVID after exposure -isolate 10 days if you ARE NOT tested for COVID as long as symptoms do not develop -isolate 7 days if you are tested and remain asymptomatic -You do not necessarily need to be tested for COVID if you have + exposure and        develop   symptoms. Just isolate at home x10 days from symptom onset During this global pandemic, CDC advises to practice social distancing, try to stay at least 48ft away from others at all times. Wear a face covering. Wash and sanitize your hands regularly and avoid going anywhere that is not necessary.  KEEP IN MIND THAT THE COVID TEST IS NOT 100% ACCURATE AND YOU SHOULD STILL DO EVERYTHING TO PREVENT POTENTIAL SPREAD OF VIRUS TO OTHERS (WEAR MASK, WEAR GLOVES, WASH HANDS AND SANITIZE REGULARLY). IF INITIAL TEST IS NEGATIVE, THIS MAY NOT MEAN YOU ARE DEFINITELY NEGATIVE. MOST ACCURATE TESTING IS DONE 5-7 DAYS AFTER EXPOSURE.   It is not advised by CDC to get re-tested after receiving a positive COVID test since you can still test positive for weeks to months after you have already cleared the virus.   *If you have not been vaccinated for  COVID, I strongly suggest you consider getting vaccinated as long as there are no contraindications.

## 2021-10-16 ENCOUNTER — Other Ambulatory Visit: Payer: Self-pay

## 2021-10-16 ENCOUNTER — Encounter (HOSPITAL_COMMUNITY): Payer: Self-pay | Admitting: Emergency Medicine

## 2021-10-16 ENCOUNTER — Ambulatory Visit (HOSPITAL_COMMUNITY)
Admission: EM | Admit: 2021-10-16 | Discharge: 2021-10-16 | Disposition: A | Discharge status: Home / Self Care | Payer: BC Managed Care – PPO | Attending: Physician Assistant | Admitting: Physician Assistant

## 2021-10-16 DIAGNOSIS — R1011 Right upper quadrant pain: Secondary | ICD-10-CM

## 2021-10-16 LAB — POCT URINALYSIS DIPSTICK, ED / UC
Bilirubin Urine: NEGATIVE
Glucose, UA: NEGATIVE mg/dL
Hgb urine dipstick: NEGATIVE
Leukocytes,Ua: NEGATIVE
Nitrite: NEGATIVE
Protein, ur: NEGATIVE mg/dL
Specific Gravity, Urine: >=1.03 (ref 1.005–1.030)
Urobilinogen, UA: 0.2 mg/dL (ref 0.0–1.0)
pH: 6.5 (ref 5.0–8.0)

## 2021-10-16 NOTE — Discharge Instructions (Addendum)
Take ibuprofen as needed If symptoms become worse recommend evaluation in the Emergency Department

## 2021-10-16 NOTE — ED Triage Notes (Addendum)
Pt presents with right side rib pain xs 2 weeks. States is a cramping pain that comes and goes.   Pt states went to an UC outside of [facility] this week and received negative results for UTI and STD Panel.

## 2021-10-16 NOTE — ED Provider Notes (Incomplete)
[facility]    CSN: [id number] Arrival date & time: 10/16/21  1952      History   Chief Complaint Chief Complaint  Patient presents with   Chest Pain    Right side   Urinary Frequency    HPI Johnny Pierce is a 26 y.o. male.   HPI  Past Medical History:  Diagnosis Date   Allergy    Asthma     Patient Active Problem List   Diagnosis Date Noted   Family history of diabetes mellitus 04/20/2017   Asthma 04/20/2017   Hammer toe 04/20/2017    Past Surgical History:  Procedure Laterality Date   TONSILLECTOMY         Home Medications    Prior to Admission medications   Medication Sig Start Date End Date Taking? Authorizing Provider  albuterol (PROVENTIL HFA;VENTOLIN HFA) 108 (90 Base) MCG/ACT inhaler Inhale into the lungs every 6 (six) hours as needed for wheezing or shortness of breath.    [provider]  ibuprofen (ADVIL) 800 MG tablet Take 1 tablet (800 mg total) by mouth every 8 (eight) hours as needed. 05/10/20   Georgetta Haber, NP  mometasone (NASONEX) 50 MCG/ACT nasal spray Place 2 sprays into the nose daily. 10/12/15   Rae Halsted, PA-C  cetirizine (ZYRTEC) 10 MG tablet Take 1 tablet (10 mg total) by mouth daily. 10/12/15 10/29/19  Rae Halsted, PA-C  promethazine (PHENERGAN) 25 MG tablet Take 1 tablet (25 mg total) by mouth every 8 (eight) hours as needed for nausea or vomiting. 05/25/17 06/30/19  Hyatt, Annye Rusk, DPM    Family History Family History  Problem Relation Age of Onset   GER disease Mother    Diabetes Mother    Diabetes Father    GER disease Maternal Grandmother     Social History Social History   Tobacco Use   Smoking status: Never   Smokeless tobacco: Never  Vaping Use   Vaping Use: Never used  Substance Use Topics   Alcohol use: Yes    Comment: occasionally   Drug use: Yes    Frequency: 1.0 times per week    Types: Marijuana    Comment: former user     Allergies   Pollen extract   Review of  Systems Review of Systems   Physical Exam Triage Vital Signs ED Triage Vitals  Enc Vitals Group     BP 10/16/21 2009 121/78     Pulse Rate 10/16/21 2009 69     Resp 10/16/21 2009 16     Temp 10/16/21 2009 98.2 F (36.8 C)     Temp Source 10/16/21 2009 Oral     SpO2 10/16/21 2009 96 %     Weight --      Height --      Head Circumference --      Peak Flow --      Pain Score 10/16/21 2007 4     Pain Loc --      Pain Edu? --      Excl. in GC? --    No data found.  Updated Vital Signs BP 121/78 (BP Location: Right Arm)    Pulse 69    Temp 98.2 F (36.8 C) (Oral)    Resp 16    SpO2 96%   Visual Acuity Right Eye Distance:   Left Eye Distance:   Bilateral Distance:    Right Eye Near:   Left Eye Near:  Bilateral Near:     Physical Exam   UC Treatments / Results  Labs (all labs ordered are listed, but only abnormal results are displayed) Labs Reviewed  POCT URINALYSIS DIPSTICK, ED / UC - Abnormal; Notable for the following components:      Result Value   Ketones, ur TRACE (*)    All other components within normal limits    EKG   Radiology No results found.  Procedures Procedures (including critical care time)  Medications Ordered in UC Medications - No data to display  Initial Impression / Assessment and Plan / UC Course  I have reviewed the triage vital signs and the nursing notes.  Pertinent labs & imaging results that were available during my care of the patient were reviewed by me and considered in my medical decision making (see chart for details).     *** Final Clinical Impressions(s) / UC Diagnoses   Final diagnoses:  RUQ pain     Discharge Instructions      Take ibuprofen as needed If symptoms become worse recommend evaluation in the Emergency Department   ED Prescriptions   None    PDMP not reviewed this encounter.

## 2022-03-01 ENCOUNTER — Emergency Department
Admission: EM | Admit: 2022-03-01 | Discharge: 2022-03-01 | Disposition: A | Discharge status: Home / Self Care | Payer: BC Managed Care – PPO | Attending: Emergency Medicine | Admitting: Emergency Medicine

## 2022-03-01 ENCOUNTER — Emergency Department: Payer: BC Managed Care – PPO

## 2022-03-01 ENCOUNTER — Other Ambulatory Visit: Payer: Self-pay

## 2022-03-01 DIAGNOSIS — N50819 Testicular pain, unspecified: Secondary | ICD-10-CM

## 2022-03-01 DIAGNOSIS — N433 Hydrocele, unspecified: Secondary | ICD-10-CM | POA: Insufficient documentation

## 2022-03-01 LAB — URINALYSIS, ROUTINE W REFLEX MICROSCOPIC
Bilirubin Urine: NEGATIVE
Glucose, UA: NEGATIVE mg/dL
Hgb urine dipstick: NEGATIVE
Ketones, ur: NEGATIVE mg/dL
Leukocytes,Ua: NEGATIVE
Nitrite: NEGATIVE
Protein, ur: NEGATIVE mg/dL
Specific Gravity, Urine: 1.025 (ref 1.005–1.030)
pH: 5 (ref 5.0–8.0)

## 2022-03-01 LAB — CHLAMYDIA/NGC RT PCR (ARMC ONLY)
Chlamydia Tr: NOT DETECTED
N gonorrhoeae: NOT DETECTED

## 2022-03-01 NOTE — ED Triage Notes (Signed)
Pt comes with c/o right testicle pain and pelvic pain. Pt states this started about two weeks ago. Pt states some pain in rectum as well.

## 2022-03-01 NOTE — Discharge Instructions (Signed)
Follow up with urology, call for an appointment

## 2022-03-01 NOTE — ED Provider Notes (Signed)
[facility] Provider Note    Event Date/Time   First MD Initiated Contact with Patient 03/01/22 1306     (approximate)   History   Testicle Pain   HPI  Johnny Pierce is a 26 y.o. male with no past medical history presents emergency department complaining of pain in the rectum with blood on the toilet paper, also having some testicular pain.  No dysuria.  No discolored ejaculation.  States testicles been hurting some but it comes and goes.      Physical Exam   Triage Vital Signs: ED Triage Vitals  Enc Vitals Group     BP 03/01/22 1209 (!) 143/92     Pulse Rate 03/01/22 1209 69     Resp 03/01/22 1209 18     Temp 03/01/22 1209 98 F (36.7 C)     Temp Source 03/01/22 1209 Oral     SpO2 03/01/22 1209 98 %     Weight 03/01/22 1321 199 lb 15.3 oz (90.7 kg)     Height 03/01/22 1321 5\' 6"  (1.676 m)     Head Circumference --      Peak Flow --      Pain Score 03/01/22 1209 7     Pain Loc --      Pain Edu? --      Excl. in GC? --     Most recent vital signs: Vitals:   03/01/22 1209  BP: (!) 143/92  Pulse: 69  Resp: 18  Temp: 98 F (36.7 C)  SpO2: 98%     General: Awake, no distress.   CV:  Good peripheral perfusion. regular rate and  rhythm Resp:  Normal effort. Abd:  No distention.   Other:  Rectal exam does not show any hemorrhoids, shows a small fissure, testicles are tender on the right and into the epididymis, no herpetic lesions noted, no obvious hernia noted   ED Results / Procedures / Treatments   Labs (all labs ordered are listed, but only abnormal results are displayed) Labs Reviewed  URINALYSIS, ROUTINE W REFLEX MICROSCOPIC - Abnormal; Notable for the following components:      Result Value   Color, Urine YELLOW (*)    APPearance CLEAR (*)    All other components within normal limits  CHLAMYDIA/NGC RT PCR ([facility] ONLY)               EKG     RADIOLOGY Ultrasound of the  scrotum    PROCEDURES:   Procedures   MEDICATIONS ORDERED IN ED: Medications - No data to display   IMPRESSION / MDM / ASSESSMENT AND PLAN / ED COURSE  I reviewed the triage vital signs and the nursing notes.                              Differential diagnosis includes, but is not limited to, epididymitis, hemorrhoids, testicular torsion, STD, prostatitis  Patient's presentation is most consistent with acute presentation with potential threat to life or bodily function.   Patient's urinalysis appears to be normal.  Is reassuring.  GC/chlamydia is pending.  Ultrasound of scrotum is pending  GC/chlamydia is negative.  Ultrasound interpreted by me as being negative for epididymitis, radiologist does comment hydroceles bilaterally.  I did explain these findings to the patient.  He is to follow-up with urology as needed.  Return emergency department worsening.  He is in agreement treatment plan.  Discharged stable  condition.   FINAL CLINICAL IMPRESSION(S) / ED DIAGNOSES   Final diagnoses:  Hydrocele in adult     Rx / DC Orders   ED Discharge Orders     None        Note:  This document was prepared using Dragon voice recognition software and may include unintentional dictation errors.    Faythe Ghee, PA-C 03/01/22 1551    Merwyn Katos, MD 03/06/22 [phone]

## 2023-01-24 ENCOUNTER — Ambulatory Visit
Admission: EM | Admit: 2023-01-24 | Discharge: 2023-01-24 | Disposition: A | Discharge status: Home / Self Care | Payer: BC Managed Care – PPO | Attending: Physician Assistant | Admitting: Physician Assistant

## 2023-01-24 ENCOUNTER — Encounter: Payer: Self-pay | Admitting: Emergency Medicine

## 2023-01-24 DIAGNOSIS — L03316 Cellulitis of umbilicus: Secondary | ICD-10-CM | POA: Diagnosis not present

## 2023-01-24 DIAGNOSIS — S30861A Insect bite (nonvenomous) of abdominal wall, initial encounter: Secondary | ICD-10-CM | POA: Diagnosis not present

## 2023-01-24 DIAGNOSIS — W57XXXA Bitten or stung by nonvenomous insect and other nonvenomous arthropods, initial encounter: Secondary | ICD-10-CM

## 2023-01-24 MED ORDER — DOXYCYCLINE HYCLATE 100 MG PO CAPS: 100.0000 mg | ORAL_CAPSULE | Freq: Two times a day (BID) | ORAL | 0 refills | Status: AC

## 2023-01-24 NOTE — ED Triage Notes (Signed)
Pt states he removed a tick from his belly button 1 week ago. A few days after he started to have pain and tightness around his belly button.

## 2023-01-24 NOTE — ED Provider Notes (Signed)
[facility]    CSN: [id number] Arrival date & time: 01/24/23  4098      History   Chief Complaint Chief Complaint  Patient presents with   Tick Removal    HPI Johnny Pierce is a 27 y.o. male presenting for 1 week history of irritation inside umbilicus.  Patient reports he pulled a tick off about a week ago.  He says he noticed pustular drainage from the area over the weekend.  Also reports that [place] the [place].  States it feels more firm than normal around the umbilicus and is a little sore when touched.  No fever, fatigue, body aches.  No rashes.  No treatment attempted so far.  HPI  Past Medical History:  Diagnosis Date   Allergy    Asthma     Patient Active Problem List   Diagnosis Date Noted   Family history of diabetes mellitus 04/20/2017   Asthma 04/20/2017   Hammer toe 04/20/2017    Past Surgical History:  Procedure Laterality Date   TONSILLECTOMY         Home Medications    Prior to Admission medications   Medication Sig Start Date End Date Taking? Authorizing Provider  doxycycline (VIBRAMYCIN) 100 MG capsule Take 1 capsule (100 mg total) by mouth 2 (two) times daily for 5 days. 01/24/23 01/29/23 Yes Eusebio Friendly B, PA-C  albuterol (PROVENTIL HFA;VENTOLIN HFA) 108 (90 Base) MCG/ACT inhaler Inhale into the lungs every 6 (six) hours as needed for wheezing or shortness of breath.    [provider]  ibuprofen (ADVIL) 800 MG tablet Take 1 tablet (800 mg total) by mouth every 8 (eight) hours as needed. 05/10/20   Georgetta Haber, NP  mometasone (NASONEX) 50 MCG/ACT nasal spray Place 2 sprays into the nose daily. 10/12/15   Rae Halsted, PA-C  cetirizine (ZYRTEC) 10 MG tablet Take 1 tablet (10 mg total) by mouth daily. 10/12/15 10/29/19  Rae Halsted, PA-C  promethazine (PHENERGAN) 25 MG tablet Take 1 tablet (25 mg total) by mouth every 8 (eight) hours as needed for nausea or vomiting. 05/25/17 06/30/19  Hyatt, Annye Rusk, DPM    Family  History Family History  Problem Relation Age of Onset   GER disease Mother    Diabetes Mother    Diabetes Father    GER disease Maternal Grandmother     Social History Social History   Tobacco Use   Smoking status: Never   Smokeless tobacco: Never  Vaping Use   Vaping Use: Never used  Substance Use Topics   Alcohol use: Yes    Comment: occasionally   Drug use: Yes    Frequency: 1.0 times per week    Types: Marijuana    Comment: former user     Allergies   Bee pollen and Pollen extract   Review of Systems Review of Systems  Constitutional:  Negative for fatigue and fever.  Gastrointestinal:  Negative for abdominal pain, diarrhea, nausea and vomiting.  Musculoskeletal:  Negative for arthralgias and myalgias.  Skin:  Positive for color change and wound. Negative for rash.  Neurological:  Negative for dizziness, weakness and headaches.  Hematological:  Negative for adenopathy.     Physical Exam Triage Vital Signs ED Triage Vitals [01/24/23 0834]  Enc Vitals Group     BP      Pulse      Resp      Temp      Temp src  SpO2      Weight      Height      Head Circumference      Peak Flow      Pain Score 5     Pain Loc      Pain Edu?      Excl. in GC?    No data found.  Updated Vital Signs BP 127/76 (BP Location: Right Arm)   Pulse 71   Temp 98 F (36.7 C) (Oral)   Resp 18   SpO2 97%     Physical Exam Vitals and nursing note reviewed.  Constitutional:      General: He is not in acute distress.    Appearance: Normal appearance. He is well-developed. He is not ill-appearing.  HENT:     Head: Normocephalic and atraumatic.  Eyes:     General: No scleral icterus.    Conjunctiva/sclera: Conjunctivae normal.  Cardiovascular:     Rate and Rhythm: Normal rate and regular rhythm.  Pulmonary:     Effort: Pulmonary effort is normal. No respiratory distress.     Breath sounds: Normal breath sounds.  Abdominal:     Palpations: Abdomen is soft.      Tenderness: There is abdominal tenderness (mildly of umbilicus).  Musculoskeletal:     Cervical back: Neck supple.  Skin:    General: Skin is warm and dry.     Capillary Refill: Capillary refill takes less than 2 seconds.     Findings: Erythema (mild erythema of umbilicus with small scabbed lesion) present.  Neurological:     General: No focal deficit present.     Mental Status: He is alert. Mental status is at baseline.     Motor: No weakness.     Gait: Gait normal.  Psychiatric:        Mood and Affect: Mood normal.        Behavior: Behavior normal.      UC Treatments / Results  Labs (all labs ordered are listed, but only abnormal results are displayed) Labs Reviewed - No data to display  EKG   Radiology No results found.  Procedures Procedures (including critical care time)  Medications Ordered in UC Medications - No data to display  Initial Impression / Assessment and Plan / UC Course  I have reviewed the triage vital signs and the nursing notes.  Pertinent labs & imaging results that were available during my care of the patient were reviewed by me and considered in my medical decision making (see chart for details).   27 year old male presents for 1 week history of irritation of umbilicus after removing a tick.  On exam there is mild erythema and scabbed over lesion.  Could represent mild soft tissue infection.  Will treat with doxycycline x 5 days.  Also advised use of hydrocortisone cream on the lesion and clean the area well with soap and water.  Reviewed return precautions.   Final Clinical Impressions(s) / UC Diagnoses   Final diagnoses:  Cellulitis of umbilicus  Tick bite of abdominal wall, initial encounter     Discharge Instructions      -Very minimal possible infection. Take antibiotics. Clean with soap and water and apply hydrocortisone cream twice daily.  -Return if fever or worsening symptoms.     ED Prescriptions     Medication Sig  Dispense Auth. Provider   doxycycline (VIBRAMYCIN) 100 MG capsule Take 1 capsule (100 mg total) by mouth 2 (two) times daily for 5 days. 10 capsule  Shirlee Latch, PA-C      PDMP not reviewed this encounter.   Shirlee Latch, PA-C 01/24/23 [phone]

## 2023-01-24 NOTE — Discharge Instructions (Signed)
-  Very minimal possible infection. Take antibiotics. Clean with soap and water and apply hydrocortisone cream twice daily.  -Return if fever or worsening symptoms.

## 2024-06-24 ENCOUNTER — Encounter: Payer: Self-pay | Admitting: Emergency Medicine

## 2024-06-24 ENCOUNTER — Ambulatory Visit
Admission: EM | Admit: 2024-06-24 | Discharge: 2024-06-24 | Disposition: A | Discharge status: Home / Self Care | Attending: Emergency Medicine | Admitting: Emergency Medicine

## 2024-06-24 DIAGNOSIS — Z113 Encounter for screening for infections with a predominantly sexual mode of transmission: Secondary | ICD-10-CM | POA: Diagnosis not present

## 2024-06-24 DIAGNOSIS — L291 Pruritus scroti: Secondary | ICD-10-CM | POA: Insufficient documentation

## 2024-06-24 DIAGNOSIS — N509 Disorder of male genital organs, unspecified: Secondary | ICD-10-CM | POA: Diagnosis present

## 2024-06-24 NOTE — ED Provider Notes (Signed)
 [facility]    CSN: [id number] Arrival date & time: 06/24/24  1202      History   Chief Complaint Chief Complaint  Patient presents with   SEXUALLY TRANSMITTED DISEASE    HPI Johnny Pierce is a 28 y.o. male.   HPI  89 old male with past medical history significant for hammertoes and asthma presents for evaluation of a lesion on the right side of his scrotum that has been present for last 2 days.  He describes the scrotum as being dry and will occasionally itch.  No pain with urination, urethral discharge, or change in his ejaculate.  He is sexually active with 2 separate partners but uses condoms.  He was requesting STI testing.  Past Medical History:  Diagnosis Date   Allergy    Asthma     Patient Active Problem List   Diagnosis Date Noted   Family history of diabetes mellitus 04/20/2017   Asthma 04/20/2017   Hammer toe 04/20/2017    Past Surgical History:  Procedure Laterality Date   TONSILLECTOMY         Home Medications    Prior to Admission medications   Medication Sig Start Date End Date Taking? Authorizing Provider  albuterol (PROVENTIL HFA;VENTOLIN HFA) 108 (90 Base) MCG/ACT inhaler Inhale into the lungs every 6 (six) hours as needed for wheezing or shortness of breath.    [provider]  ibuprofen  (ADVIL ) 800 MG tablet Take 1 tablet (800 mg total) by mouth every 8 (eight) hours as needed. 05/10/20   Burky, Natalie B, NP  mometasone  (NASONEX ) 50 MCG/ACT nasal spray Place 2 sprays into the nose daily. 10/12/15   Jama Mitzie ORN, PA-C  cetirizine  (ZYRTEC ) 10 MG tablet Take 1 tablet (10 mg total) by mouth daily. 10/12/15 10/29/19  Jama Mitzie ORN, PA-C  promethazine  (PHENERGAN ) 25 MG tablet Take 1 tablet (25 mg total) by mouth every 8 (eight) hours as needed for nausea or vomiting. 05/25/17 06/30/19  Hyatt, Royden DASEN, DPM    Family History Family History  Problem Relation Age of Onset   GER disease Mother    Diabetes Mother    Diabetes  Father    GER disease Maternal Grandmother     Social History Social History   Tobacco Use   Smoking status: Never   Smokeless tobacco: Never  Vaping Use   Vaping status: Never Used  Substance Use Topics   Alcohol use: Yes    Comment: occasionally   Drug use: Yes    Frequency: 1.0 times per week    Types: Marijuana    Comment: former user     Allergies   Bee pollen, Gramineae pollens, and Pollen extract   Review of Systems Review of Systems  Genitourinary:  Positive for genital sores. Negative for dysuria, frequency, hematuria, penile discharge, penile pain, penile swelling, scrotal swelling, testicular pain and urgency.     Physical Exam Triage Vital Signs ED Triage Vitals  Encounter Vitals Group     BP      Girls Systolic BP Percentile      Girls Diastolic BP Percentile      Boys Systolic BP Percentile      Boys Diastolic BP Percentile      Pulse      Resp      Temp      Temp src      SpO2      Weight      Height  Head Circumference      Peak Flow      Pain Score      Pain Loc      Pain Education      Exclude from Growth Chart    No data found.  Updated Vital Signs BP (!) 144/79 (BP Location: Left Arm)   Pulse 72   Temp 98.1 F (36.7 C) (Oral)   Resp 18   SpO2 96%   Visual Acuity Right Eye Distance:   Left Eye Distance:   Bilateral Distance:    Right Eye Near:   Left Eye Near:    Bilateral Near:     Physical Exam Vitals and nursing note reviewed.  Constitutional:      Appearance: Normal appearance. He is not ill-appearing.  HENT:     Head: Normocephalic and atraumatic.  Genitourinary:    Penis: Normal.      Testes: Normal.  Skin:    General: Skin is warm and dry.     Capillary Refill: Capillary refill takes less than 2 seconds.  Neurological:     General: No focal deficit present.     Mental Status: He is alert and oriented to person, place, and time.      UC Treatments / Results  Labs (all labs ordered are listed,  but only abnormal results are displayed) Labs Reviewed  HIV ANTIBODY (ROUTINE TESTING W REFLEX)  RPR  CYTOLOGY, (ORAL, ANAL, URETHRAL) ANCILLARY ONLY    EKG   Radiology No results found.  Procedures Procedures (including critical care time)  Medications Ordered in UC Medications - No data to display  Initial Impression / Assessment and Plan / UC Course  I have reviewed the triage vital signs and the nursing notes.  Pertinent labs & imaging results that were available during my care of the patient were reviewed by me and considered in my medical decision making (see chart for details).   Patient is a nontoxic-appearing 28 year old male presenting for evaluation of 2 separate scrotal lesions on the right-hand side of his scrotum.  The first lesion is a mole that is high up on the lateral right side.  It is mildly raised and does have an irregular border.  The mole is brown in color.  I will refer him to dermatology for further evaluation.  The second lesion is more anterior and inferior on the scrotum and appears to be a scabbed lesion that may possibly be from a pimple or an ingrown hair that is resolving.  He reports that he does sometimes shave his scrotum but he has tried not to to prevent having ingrown hairs formed.  He is not experiencing any penile discharge and there is no discharge from the urethral meatus.  No lesions on the glans penis or the shaft of the penis.  No testicular tenderness and bilateral testicles are normal volume with a smooth texture.  Epididymis is nontender.  Spermatic cords are unremarkable.  The patient reports that he would still like to be tested for STIs still have given him a cytology swab to self collect.  He is also requesting HIV and RPR and I will order those.  I advised him that if his test can be positive he will be contacted by phone and treatment options will be provided.  If his results are negative they will appear his MyChart.  His scrotum does  reflect mild dryness.  I have advised him to use plain coconut oil as a moisturizer.  I will also  refer the patient to dermatology for further evaluation of the mole on his scrotum.   Final Clinical Impressions(s) / UC Diagnoses   Final diagnoses:  Scrotal skin lesion     Discharge Instructions      As we discussed, the 1 lesion on your scrotum appears to be a mole.  It has a slightly irregular border so I am referring you to dermatology for further evaluation.  They will contact you to make an appointment.  The skin on your scrotum is mildly dry and I recommend using coconut oil as a moisturizer following bathing.    We have collected testing today to evaluate for possible STIs.  If any of your test are positive you will be contacted by phone and treatment options will be provided.  If your results are negative they will appear in your MyChart.     ED Prescriptions   None    PDMP not reviewed this encounter.   Bernardino Ditch, NP 06/24/24 1254

## 2024-06-24 NOTE — Discharge Instructions (Addendum)
 As we discussed, the 1 lesion on your scrotum appears to be a mole.  It has a slightly irregular border so I am referring you to dermatology for further evaluation.  They will contact you to make an appointment.  The skin on your scrotum is mildly dry and I recommend using coconut oil as a moisturizer following bathing.    We have collected testing today to evaluate for possible STIs.  If any of your test are positive you will be contacted by phone and treatment options will be provided.  If your results are negative they will appear in your MyChart.

## 2024-06-24 NOTE — ED Triage Notes (Signed)
 Pt present bump on sac of penis with itching. Pt state the area is real dry. Pt would like to have a STD screening.

## 2024-06-25 LAB — CYTOLOGY, (ORAL, ANAL, URETHRAL) ANCILLARY ONLY
Chlamydia: NEGATIVE
Comment: NEGATIVE
Comment: NEGATIVE
Comment: NORMAL
Neisseria Gonorrhea: NEGATIVE
Trichomonas: NEGATIVE
